# Patient Record
Sex: Female | Born: 1940 | Smoking: Former smoker
Health system: Southern US, Community
[De-identification: ages and names within clinical notes are randomized; demographics above are authoritative.]

## PROBLEM LIST (undated history)

## (undated) DIAGNOSIS — I4891 Unspecified atrial fibrillation: Secondary | ICD-10-CM

## (undated) DIAGNOSIS — I10 Essential (primary) hypertension: Secondary | ICD-10-CM

## (undated) DIAGNOSIS — I639 Cerebral infarction, unspecified: Secondary | ICD-10-CM

## (undated) DIAGNOSIS — E785 Hyperlipidemia, unspecified: Secondary | ICD-10-CM

## (undated) DIAGNOSIS — Z950 Presence of cardiac pacemaker: Secondary | ICD-10-CM

## (undated) HISTORY — DX: Cerebral infarction, unspecified: I63.9

## (undated) HISTORY — DX: Essential (primary) hypertension: I10

## (undated) HISTORY — DX: Hyperlipidemia, unspecified: E78.5

## (undated) HISTORY — PX: ABDOMINAL HYSTERECTOMY: SHX81

---

## 2018-05-05 ENCOUNTER — Inpatient Hospital Stay (HOSPITAL_COMMUNITY): Payer: Medicare Other | Admitting: Anesthesiology

## 2018-05-05 ENCOUNTER — Emergency Department (HOSPITAL_COMMUNITY): Payer: Medicare Other

## 2018-05-05 ENCOUNTER — Encounter (HOSPITAL_COMMUNITY)
Admission: EM | Disposition: A | Discharge status: Home Health | Payer: Self-pay | Source: Home / Self Care | Attending: Neurology

## 2018-05-05 ENCOUNTER — Inpatient Hospital Stay (HOSPITAL_COMMUNITY)
Admission: EM | Admit: 2018-05-05 | Discharge: 2018-05-10 | DRG: 023 | Disposition: A | Discharge status: Home Health | Payer: Medicare Other | Attending: Neurology | Admitting: Neurology

## 2018-05-05 DIAGNOSIS — R0902 Hypoxemia: Secondary | ICD-10-CM | POA: Diagnosis not present

## 2018-05-05 DIAGNOSIS — I609 Nontraumatic subarachnoid hemorrhage, unspecified: Secondary | ICD-10-CM

## 2018-05-05 DIAGNOSIS — Z9889 Other specified postprocedural states: Secondary | ICD-10-CM

## 2018-05-05 DIAGNOSIS — I63311 Cerebral infarction due to thrombosis of right middle cerebral artery: Secondary | ICD-10-CM

## 2018-05-05 DIAGNOSIS — I6601 Occlusion and stenosis of right middle cerebral artery: Secondary | ICD-10-CM | POA: Diagnosis not present

## 2018-05-05 DIAGNOSIS — Z6833 Body mass index (BMI) 33.0-33.9, adult: Secondary | ICD-10-CM

## 2018-05-05 DIAGNOSIS — I10 Essential (primary) hypertension: Secondary | ICD-10-CM | POA: Diagnosis present

## 2018-05-05 DIAGNOSIS — E876 Hypokalemia: Secondary | ICD-10-CM

## 2018-05-05 DIAGNOSIS — R414 Neurologic neglect syndrome: Secondary | ICD-10-CM | POA: Diagnosis present

## 2018-05-05 DIAGNOSIS — T81718A Complication of other artery following a procedure, not elsewhere classified, initial encounter: Secondary | ICD-10-CM

## 2018-05-05 DIAGNOSIS — I729 Aneurysm of unspecified site: Secondary | ICD-10-CM | POA: Diagnosis not present

## 2018-05-05 DIAGNOSIS — R471 Dysarthria and anarthria: Secondary | ICD-10-CM | POA: Diagnosis present

## 2018-05-05 DIAGNOSIS — I63511 Cerebral infarction due to unspecified occlusion or stenosis of right middle cerebral artery: Secondary | ICD-10-CM | POA: Diagnosis not present

## 2018-05-05 DIAGNOSIS — C7802 Secondary malignant neoplasm of left lung: Secondary | ICD-10-CM | POA: Diagnosis present

## 2018-05-05 DIAGNOSIS — I63411 Cerebral infarction due to embolism of right middle cerebral artery: Secondary | ICD-10-CM | POA: Diagnosis present

## 2018-05-05 DIAGNOSIS — C801 Malignant (primary) neoplasm, unspecified: Secondary | ICD-10-CM | POA: Diagnosis present

## 2018-05-05 DIAGNOSIS — I4891 Unspecified atrial fibrillation: Secondary | ICD-10-CM | POA: Diagnosis present

## 2018-05-05 DIAGNOSIS — R29810 Facial weakness: Secondary | ICD-10-CM | POA: Diagnosis present

## 2018-05-05 DIAGNOSIS — D62 Acute posthemorrhagic anemia: Secondary | ICD-10-CM | POA: Diagnosis not present

## 2018-05-05 DIAGNOSIS — C7801 Secondary malignant neoplasm of right lung: Secondary | ICD-10-CM | POA: Diagnosis present

## 2018-05-05 DIAGNOSIS — Z7901 Long term (current) use of anticoagulants: Secondary | ICD-10-CM

## 2018-05-05 DIAGNOSIS — I724 Aneurysm of artery of lower extremity: Secondary | ICD-10-CM | POA: Diagnosis not present

## 2018-05-05 DIAGNOSIS — I700 Atherosclerosis of aorta: Secondary | ICD-10-CM | POA: Diagnosis present

## 2018-05-05 DIAGNOSIS — I608 Other nontraumatic subarachnoid hemorrhage: Secondary | ICD-10-CM | POA: Diagnosis not present

## 2018-05-05 DIAGNOSIS — G8194 Hemiplegia, unspecified affecting left nondominant side: Secondary | ICD-10-CM | POA: Diagnosis present

## 2018-05-05 DIAGNOSIS — K869 Disease of pancreas, unspecified: Secondary | ICD-10-CM | POA: Diagnosis present

## 2018-05-05 DIAGNOSIS — R918 Other nonspecific abnormal finding of lung field: Secondary | ICD-10-CM | POA: Diagnosis not present

## 2018-05-05 DIAGNOSIS — E785 Hyperlipidemia, unspecified: Secondary | ICD-10-CM | POA: Diagnosis present

## 2018-05-05 DIAGNOSIS — R29711 NIHSS score 11: Secondary | ICD-10-CM | POA: Diagnosis present

## 2018-05-05 DIAGNOSIS — I639 Cerebral infarction, unspecified: Secondary | ICD-10-CM

## 2018-05-05 DIAGNOSIS — K8689 Other specified diseases of pancreas: Secondary | ICD-10-CM | POA: Diagnosis present

## 2018-05-05 DIAGNOSIS — K661 Hemoperitoneum: Secondary | ICD-10-CM | POA: Diagnosis not present

## 2018-05-05 DIAGNOSIS — Z950 Presence of cardiac pacemaker: Secondary | ICD-10-CM

## 2018-05-05 DIAGNOSIS — C7800 Secondary malignant neoplasm of unspecified lung: Secondary | ICD-10-CM | POA: Diagnosis present

## 2018-05-05 DIAGNOSIS — I5030 Unspecified diastolic (congestive) heart failure: Secondary | ICD-10-CM | POA: Diagnosis not present

## 2018-05-05 HISTORY — PX: RADIOLOGY WITH ANESTHESIA: SHX6223

## 2018-05-05 HISTORY — DX: Unspecified atrial fibrillation: I48.91

## 2018-05-05 HISTORY — DX: Presence of cardiac pacemaker: Z95.0

## 2018-05-05 LAB — I-STAT CHEM 8, ED
BUN: 16 mg/dL (ref 8–23)
CREATININE: 0.7 mg/dL (ref 0.44–1.00)
Calcium, Ion: 1.19 mmol/L (ref 1.15–1.40)
Chloride: 106 mmol/L (ref 98–111)
GLUCOSE: 109 mg/dL — AB (ref 70–99)
HCT: 36 % (ref 36.0–46.0)
HEMOGLOBIN: 12.2 g/dL (ref 12.0–15.0)
Potassium: 3.3 mmol/L — ABNORMAL LOW (ref 3.5–5.1)
Sodium: 140 mmol/L (ref 135–145)
TCO2: 24 mmol/L (ref 22–32)

## 2018-05-05 LAB — COMPREHENSIVE METABOLIC PANEL
ALK PHOS: 153 U/L — AB (ref 38–126)
ALT: 17 U/L (ref 0–44)
AST: 26 U/L (ref 15–41)
Albumin: 3.5 g/dL (ref 3.5–5.0)
Anion gap: 9 (ref 5–15)
BUN: 14 mg/dL (ref 8–23)
CO2: 23 mmol/L (ref 22–32)
CREATININE: 0.83 mg/dL (ref 0.44–1.00)
Calcium: 9.6 mg/dL (ref 8.9–10.3)
Chloride: 105 mmol/L (ref 98–111)
Glucose, Bld: 107 mg/dL — ABNORMAL HIGH (ref 70–99)
Potassium: 3.1 mmol/L — ABNORMAL LOW (ref 3.5–5.1)
Sodium: 137 mmol/L (ref 135–145)
Total Bilirubin: 0.5 mg/dL (ref 0.3–1.2)
Total Protein: 7.2 g/dL (ref 6.5–8.1)

## 2018-05-05 LAB — DIFFERENTIAL
Abs Immature Granulocytes: 0.02 10*3/uL (ref 0.00–0.07)
Basophils Absolute: 0.1 10*3/uL (ref 0.0–0.1)
Basophils Relative: 1 %
Eosinophils Absolute: 0.2 10*3/uL (ref 0.0–0.5)
Eosinophils Relative: 3 %
Immature Granulocytes: 0 %
LYMPHS PCT: 11 %
Lymphs Abs: 1 10*3/uL (ref 0.7–4.0)
MONO ABS: 0.7 10*3/uL (ref 0.1–1.0)
MONOS PCT: 8 %
NEUTROS ABS: 6.9 10*3/uL (ref 1.7–7.7)
NEUTROS PCT: 77 %

## 2018-05-05 LAB — APTT: APTT: 37 s — AB (ref 24–36)

## 2018-05-05 LAB — CBG MONITORING, ED: Glucose-Capillary: 98 mg/dL (ref 70–99)

## 2018-05-05 LAB — PROTIME-INR
INR: 1.32
Prothrombin Time: 16.3 seconds — ABNORMAL HIGH (ref 11.4–15.2)

## 2018-05-05 LAB — CBC
HEMATOCRIT: 30.1 % — AB (ref 36.0–46.0)
HEMOGLOBIN: 9.8 g/dL — AB (ref 12.0–15.0)
MCH: 26.3 pg (ref 26.0–34.0)
MCHC: 32.6 g/dL (ref 30.0–36.0)
MCV: 80.9 fL (ref 80.0–100.0)
Platelets: 196 10*3/uL (ref 150–400)
RBC: 3.72 MIL/uL — ABNORMAL LOW (ref 3.87–5.11)
RDW: 16.5 % — ABNORMAL HIGH (ref 11.5–15.5)
WBC: 8.9 10*3/uL (ref 4.0–10.5)
nRBC: 0 % (ref 0.0–0.2)

## 2018-05-05 LAB — I-STAT TROPONIN, ED: TROPONIN I, POC: 0 ng/mL (ref 0.00–0.08)

## 2018-05-05 SURGERY — RADIOLOGY WITH ANESTHESIA
Anesthesia: General

## 2018-05-05 MED ORDER — SENNOSIDES-DOCUSATE SODIUM 8.6-50 MG PO TABS
1.0000 | ORAL_TABLET | Freq: Every evening | ORAL | Status: DC | PRN
  Administered 2018-05-07 – 2018-05-08 (×2): 1 via ORAL
  Filled 2018-05-05 (×2): qty 1

## 2018-05-05 MED ORDER — CEFAZOLIN SODIUM-DEXTROSE 2-4 GM/100ML-% IV SOLN
INTRAVENOUS | Status: AC
  Filled 2018-05-05: qty 100

## 2018-05-05 MED ORDER — ACETAMINOPHEN 160 MG/5ML PO SOLN: 650.0000 mg | ORAL | Status: DC | PRN

## 2018-05-05 MED ORDER — LIDOCAINE 2% (20 MG/ML) 5 ML SYRINGE
INTRAMUSCULAR | Status: DC | PRN
  Administered 2018-05-05: 60 mg via INTRAVENOUS

## 2018-05-05 MED ORDER — CEFAZOLIN SODIUM-DEXTROSE 2-3 GM-%(50ML) IV SOLR
INTRAVENOUS | Status: DC | PRN
  Administered 2018-05-05: 2 g via INTRAVENOUS

## 2018-05-05 MED ORDER — LIDOCAINE HCL 1 % IJ SOLN
INTRAMUSCULAR | Status: AC
  Filled 2018-05-05: qty 20

## 2018-05-05 MED ORDER — NITROGLYCERIN 1 MG/10 ML FOR IR/CATH LAB
INTRA_ARTERIAL | Status: AC | PRN
  Administered 2018-05-05 (×6): 25 ug via INTRA_ARTERIAL

## 2018-05-05 MED ORDER — CLOPIDOGREL BISULFATE 300 MG PO TABS
ORAL_TABLET | ORAL | Status: AC
  Filled 2018-05-05: qty 1

## 2018-05-05 MED ORDER — TIROFIBAN HCL IN NACL 5-0.9 MG/100ML-% IV SOLN
INTRAVENOUS | Status: AC
  Filled 2018-05-05: qty 100

## 2018-05-05 MED ORDER — ASPIRIN 81 MG PO CHEW
CHEWABLE_TABLET | ORAL | Status: AC | PRN
  Administered 2018-05-05: 81 mg

## 2018-05-05 MED ORDER — LABETALOL HCL 5 MG/ML IV SOLN
INTRAVENOUS | Status: AC
  Filled 2018-05-05: qty 4

## 2018-05-05 MED ORDER — ACETAMINOPHEN 325 MG PO TABS: 650.0000 mg | ORAL_TABLET | ORAL | Status: DC | PRN

## 2018-05-05 MED ORDER — ACETAMINOPHEN 650 MG RE SUPP: 650.0000 mg | RECTAL | Status: DC | PRN

## 2018-05-05 MED ORDER — ASPIRIN 325 MG PO TABS
ORAL_TABLET | ORAL | Status: AC
  Filled 2018-05-05: qty 1

## 2018-05-05 MED ORDER — SUCCINYLCHOLINE CHLORIDE 20 MG/ML IJ SOLN
INTRAMUSCULAR | Status: DC | PRN
  Administered 2018-05-05: 80 mg via INTRAVENOUS

## 2018-05-05 MED ORDER — IOPAMIDOL (ISOVUE-300) INJECTION 61%
INTRAVENOUS | Status: AC
  Filled 2018-05-05: qty 100

## 2018-05-05 MED ORDER — NITROGLYCERIN 1 MG/10 ML FOR IR/CATH LAB
INTRA_ARTERIAL | Status: AC
  Filled 2018-05-05: qty 10

## 2018-05-05 MED ORDER — IOHEXOL 300 MG/ML  SOLN
150.0000 mL | Freq: Once | INTRAMUSCULAR | Status: AC | PRN
  Administered 2018-05-05: 75 mL via INTRA_ARTERIAL

## 2018-05-05 MED ORDER — TICAGRELOR 90 MG PO TABS
ORAL_TABLET | ORAL | Status: AC
  Filled 2018-05-05: qty 2

## 2018-05-05 MED ORDER — PROPOFOL 10 MG/ML IV BOLUS
INTRAVENOUS | Status: DC | PRN
  Administered 2018-05-05: 90 mg via INTRAVENOUS
  Administered 2018-05-05: 20 mg via INTRAVENOUS

## 2018-05-05 MED ORDER — SODIUM CHLORIDE 0.9 % IV SOLN: INTRAVENOUS | Status: DC

## 2018-05-05 MED ORDER — LACTATED RINGERS IV SOLN
INTRAVENOUS | Status: DC | PRN
  Administered 2018-05-05 – 2018-05-06 (×2): via INTRAVENOUS

## 2018-05-05 MED ORDER — ASPIRIN 81 MG PO CHEW
CHEWABLE_TABLET | ORAL | Status: AC
  Filled 2018-05-05: qty 1

## 2018-05-05 MED ORDER — ROCURONIUM BROMIDE 50 MG/5ML IV SOSY
PREFILLED_SYRINGE | INTRAVENOUS | Status: DC | PRN
  Administered 2018-05-05: 10 mg via INTRAVENOUS
  Administered 2018-05-05: 50 mg via INTRAVENOUS
  Administered 2018-05-05: 10 mg via INTRAVENOUS

## 2018-05-05 MED ORDER — TICAGRELOR 60 MG PO TABS
ORAL_TABLET | ORAL | Status: AC | PRN
  Administered 2018-05-05: 180 mg

## 2018-05-05 MED ORDER — IOPAMIDOL (ISOVUE-370) INJECTION 76%
50.0000 mL | Freq: Once | INTRAVENOUS | Status: AC | PRN
  Administered 2018-05-05: 50 mL via INTRAVENOUS

## 2018-05-05 MED ORDER — IOPAMIDOL (ISOVUE-370) INJECTION 76%
INTRAVENOUS | Status: AC
  Filled 2018-05-05: qty 50

## 2018-05-05 MED ORDER — STROKE: EARLY STAGES OF RECOVERY BOOK
Freq: Once | Status: AC
  Administered 2018-05-07: 15:00:00
  Filled 2018-05-05: qty 1

## 2018-05-05 MED ORDER — PHENYLEPHRINE HCL 10 MG/ML IJ SOLN
INTRAMUSCULAR | Status: DC | PRN
  Administered 2018-05-05 (×5): 80 ug via INTRAVENOUS

## 2018-05-05 MED ORDER — FENTANYL CITRATE (PF) 100 MCG/2ML IJ SOLN
INTRAMUSCULAR | Status: DC | PRN
  Administered 2018-05-05: 100 ug via INTRAVENOUS

## 2018-05-05 MED ORDER — EPTIFIBATIDE 20 MG/10ML IV SOLN
INTRAVENOUS | Status: AC
  Filled 2018-05-05: qty 10

## 2018-05-05 NOTE — Anesthesia Preprocedure Evaluation (Addendum)
Anesthesia Evaluation  Patient identified by MRN, date of birth, ID band Patient awake    Reviewed: Allergy & Precautions, H&P , NPO status , Patient's Chart, lab work & pertinent test results  Airway Mallampati: II  TM Distance: >3 FB Neck ROM: Full    Dental no notable dental hx. (+) Edentulous Upper, Partial Lower, Dental Advisory Given   Pulmonary neg pulmonary ROS,    Pulmonary exam normal breath sounds clear to auscultation       Cardiovascular + dysrhythmias Atrial Fibrillation  Rhythm:Regular Rate:Normal     Neuro/Psych CVA, Residual Symptoms negative psych ROS   GI/Hepatic negative GI ROS, Neg liver ROS,   Endo/Other  negative endocrine ROS  Renal/GU negative Renal ROS  negative genitourinary   Musculoskeletal   Abdominal   Peds  Hematology negative hematology ROS (+)   Anesthesia Other Findings   Reproductive/Obstetrics negative OB ROS                            Anesthesia Physical Anesthesia Plan  ASA: III and emergent  Anesthesia Plan: General   Post-op Pain Management:    Induction: Intravenous, Rapid sequence and Cricoid pressure planned  PONV Risk Score and Plan: 3 and Ondansetron and Treatment may vary due to age or medical condition  Airway Management Planned: Oral ETT  Additional Equipment: Arterial line  Intra-op Plan:   Post-operative Plan: Extubation in OR and Possible Post-op intubation/ventilation  Informed Consent: I have reviewed the patients History and Physical, chart, labs and discussed the procedure including the risks, benefits and alternatives for the proposed anesthesia with the patient or authorized representative who has indicated his/her understanding and acceptance.   Dental advisory given  Plan Discussed with: CRNA  Anesthesia Plan Comments:        Anesthesia Quick Evaluation

## 2018-05-05 NOTE — H&P (Signed)
Chief Complaint: Fall, left facial droop, left side weakness  History obtained from: Patient and Chart    HPI:                                                                                                                                       Deanna Guerrero is an 77 y.o. female with past medical history of atrial fibrillation on Eliquis, recent pacemaker placement presents to the emergency room as a code stroke for left-sided weakness and neglect and facial droop.  She was last seen normal around 7 PM by her family.  Around 715 patient had a fall and noted to be weak on the left side.  EMS was called who noted the patient had a facial droop neglect left-sided weakness and called code stroke.  Blood pressure was 250 systolic on arrival.  Patient on Eliquis at home, denies missing medications.  CT head showed no acute abnormality.  She was not a TPA candidate as she was on anticoagulation.  CT angiogram revealed a right M1-M2 occlusion, and IR was activated.  Date last known well: 10.12.19 Time last known well: 7pm tPA Given: no, on Eliquis NIHSS: 11 Baseline MRS 0   PMH Afib Pacemaker placement     No family history on file.   Social History:  has no tobacco, alcohol, and drug history on file.  Allergies: Allergies not on file  Medications:                                                                                                                      I reviewed home medications. She is on Eliquis   ROS:  14 systems reviewed and negative except above    Examination:                                                                                                      General: Appears well-developed  Psych: Affect appropriate to situation Eyes: No scleral injection HENT: No OP obstrucion Head: Normocephalic.  Cardiovascular: Normal  rate and regular rhythm.  Respiratory: Effort normal and breath sounds normal to anterior ascultation GI: Soft.  No distension. There is no tenderness.  Skin: WDI    Neurological Examination Mental Status: Alert, oriented, thought content appropriate.  Speech dysarthric, follows simple commands. Cranial Nerves: II: Visual fields : Left homonymous hemianopsia III,IV, VI: ptosis not present, extra-ocular motions intact bilaterally, pupils equal, round, reactive to light and accommodation, gaze can overcome midline V,VII: left Facial droop  VIII: hearing normal bilaterally IX,X: uvula rises symmetrically XI: bilateral shoulder shrug XII: midline tongue extension Motor: Right : Upper extremity   5/5    Left:     Upper extremity   1/5  Lower extremity   5/5     Lower extremity   2+/5 Tone and bulk:normal tone throughout; no atrophy noted Sensory: Reduced over left arm leg and face with neglect Deep Tendon Reflexes: 1+ and symmetric throughout Plantars: Right: downgoing   Left: downgoing Cerebellar: normal finger-to-nose, normal on right side Gait: Unable to walk     Lab Results: Basic Metabolic Panel: Recent Labs  Lab 05/05/18 2009  NA 140  K 3.3*  CL 106  GLUCOSE 109*  BUN 16  CREATININE 0.70    CBC: Recent Labs  Lab 05/05/18 2009 05/05/18 2031  WBC  --  PENDING  HGB 12.2 9.8*  HCT 36.0 30.1*  MCV  --  80.9  PLT  --  196    Coagulation Studies: No results for input(s): LABPROT, INR in the last 72 hours.  Imaging: Ct Angio Head W Or Wo Contrast  Result Date: 05/05/2018 CLINICAL DATA:  LEFT-sided weakness. EXAM: CT ANGIOGRAPHY HEAD AND NECK TECHNIQUE: Multidetector CT imaging of the head and neck was performed using the standard protocol during bolus administration of intravenous contrast. Multiplanar CT image reconstructions and MIPs were obtained to evaluate the vascular anatomy. Carotid stenosis measurements (when applicable) are obtained utilizing NASCET  criteria, using the distal internal carotid diameter as the denominator. CONTRAST:  41mL ISOVUE-370 IOPAMIDOL (ISOVUE-370) INJECTION 76% COMPARISON:  Code stroke CT earlier today. FINDINGS: CTA NECK FINDINGS Aortic arch: Standard branching. Imaged portion shows no evidence of aneurysm or dissection. No significant stenosis of the major arch vessel origins. Right carotid system: No evidence of dissection, stenosis (50% or greater) or occlusion. Minor atheromatous change. Left carotid system: No evidence of dissection, stenosis (50% or greater) or occlusion. Minor atheromatous change. Vertebral arteries: Codominant. No evidence of dissection, stenosis (50% or greater) or occlusion. Skeleton: Spondylosis.  Poor dentition. Other neck: No masses.  Airway patent. Upper chest: Widespread BILATERAL pulmonary nodules, subcentimeter, too numerous to count, slightly larger and more numerous to the LEFT. There is mediastinal adenopathy, incompletely evaluated. These nodules  could represent diffuse infection or widespread metastatic disease. Small RIGHT effusion. Aortic atherosclerosis is noted. Dual lead pacemaker enters the heart from LEFT subclavian approach. Review of the MIP images confirms the above findings CTA HEAD FINDINGS Anterior circulation: Minor calcific atheromatous change in the BILATERAL carotid siphons. There is a RIGHT MCA M2 posterior division occlusion, 3 mm from the bifurcation. No M1 stenosis on the RIGHT. Both anterior cerebral arteries are patent, LEFT dominant. No LEFT MCA disease of significance. Posterior circulation: No significant stenosis, proximal occlusion, aneurysm, or vascular malformation. Venous sinuses: As permitted by contrast timing, patent. Anatomic variants: RIGHT fetal PCA. Delayed phase: Not performed. Review of the MIP images confirms the above findings IMPRESSION: No extracranial stenosis or dissection. BILATERAL carotid atheromatous change. Acute RIGHT MCA M2 occlusion, 3 mm from  the bifurcation. Innumerable subcentimeter pulmonary nodules, which could represent infection or metastatic disease. CT chest with contrast could provide additional information. These results were called by telephone at the time of interpretation on 05/05/2018 at 8:40 pm to Dr. Samara Snide , who verbally acknowledged these results. Electronically Signed   By: Staci Righter M.D.   On: 05/05/2018 20:51   Ct Angio Neck W Or Wo Contrast  Result Date: 05/05/2018 CLINICAL DATA:  LEFT-sided weakness. EXAM: CT ANGIOGRAPHY HEAD AND NECK TECHNIQUE: Multidetector CT imaging of the head and neck was performed using the standard protocol during bolus administration of intravenous contrast. Multiplanar CT image reconstructions and MIPs were obtained to evaluate the vascular anatomy. Carotid stenosis measurements (when applicable) are obtained utilizing NASCET criteria, using the distal internal carotid diameter as the denominator. CONTRAST:  53mL ISOVUE-370 IOPAMIDOL (ISOVUE-370) INJECTION 76% COMPARISON:  Code stroke CT earlier today. FINDINGS: CTA NECK FINDINGS Aortic arch: Standard branching. Imaged portion shows no evidence of aneurysm or dissection. No significant stenosis of the major arch vessel origins. Right carotid system: No evidence of dissection, stenosis (50% or greater) or occlusion. Minor atheromatous change. Left carotid system: No evidence of dissection, stenosis (50% or greater) or occlusion. Minor atheromatous change. Vertebral arteries: Codominant. No evidence of dissection, stenosis (50% or greater) or occlusion. Skeleton: Spondylosis.  Poor dentition. Other neck: No masses.  Airway patent. Upper chest: Widespread BILATERAL pulmonary nodules, subcentimeter, too numerous to count, slightly larger and more numerous to the LEFT. There is mediastinal adenopathy, incompletely evaluated. These nodules could represent diffuse infection or widespread metastatic disease. Small RIGHT effusion. Aortic  atherosclerosis is noted. Dual lead pacemaker enters the heart from LEFT subclavian approach. Review of the MIP images confirms the above findings CTA HEAD FINDINGS Anterior circulation: Minor calcific atheromatous change in the BILATERAL carotid siphons. There is a RIGHT MCA M2 posterior division occlusion, 3 mm from the bifurcation. No M1 stenosis on the RIGHT. Both anterior cerebral arteries are patent, LEFT dominant. No LEFT MCA disease of significance. Posterior circulation: No significant stenosis, proximal occlusion, aneurysm, or vascular malformation. Venous sinuses: As permitted by contrast timing, patent. Anatomic variants: RIGHT fetal PCA. Delayed phase: Not performed. Review of the MIP images confirms the above findings IMPRESSION: No extracranial stenosis or dissection. BILATERAL carotid atheromatous change. 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 Innumerable subcentimeter pulmonary nodules, which could represent infection or metastatic disease. CT chest with contrast could provide additional information. These results were called by telephone at the time of interpretation on 05/05/2018 at 8:40 pm to Dr. Samara Snide , who verbally acknowledged these results. Electronically Signed   By: Staci Righter M.D.   On: 05/05/2018 20:51  Ct Head Code Stroke Wo Contrast  Result Date: 05/05/2018 CLINICAL DATA:  Code stroke.  LEFT-sided deficits. EXAM: CT HEAD WITHOUT CONTRAST TECHNIQUE: Contiguous axial images were obtained from the base of the skull through the vertex without intravenous contrast. COMPARISON:  None. FINDINGS: Brain: No evidence for acute infarction, hemorrhage, mass lesion, hydrocephalus, or extra-axial fluid. Generalized atrophy. Hypoattenuation of white matter, likely small vessel disease. Vascular: Calcification of the cavernous internal carotid arteries consistent with cerebrovascular atherosclerotic disease. No signs of intracranial large vessel occlusion. Skull:  Calvarium intact.  No worrisome osseous lesion. Sinuses/Orbits: Dense lenticular opacities. No significant sinus fluid. Other: None. ASPECTS [facility] Stroke Program Early CT Score) - Ganglionic level infarction (caudate, lentiform nuclei, internal capsule, insula, M1-M3 cortex): 7 - Supraganglionic infarction (M4-M6 cortex): 3 Total score (0-10 with 10 being normal): 10 IMPRESSION: 1. Atrophy and small vessel disease. No acute intracranial findings. No signs of large vessel occlusion. 2. ASPECTS is 10. These results were communicated to Dr. Lorraine Lax at 8:25 pmon 10/12/2019by text page via the [facility] messaging system. Electronically Signed   By: Staci Righter M.D.   On: 05/05/2018 20:26     ASSESSMENT AND PLAN  77 year old female with history of atrial fibrillation on Eliquis presents with sudden onset left-sided weakness and neglect.  CTA showed a right M2 occlusion.  Patient underwent IR, however required stenting due to reocclusion.  She achieved TICI 3 recanalization and extubated, doing well postprocedure.  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  She is at risk for significant risk of neurological worsening from cerebral edema,  death from brain herniation, heart failure, hemorrhagic conversion, infection, respiratory failure and seizure. This  patient's care requires constant monitoring of vital signs, hemodynamics, respiratory and cardiac monitoring, review of multiple databases, neurological assessment, discussion with family, other specialists and medical decision making of high complexity.  I spent 70  minutes of neurocritical time in the care of this patient.      Sushanth Aroor [facility] Pager Number [phone]   Please page stroke NP  Or  PA  Or MD from 8am -4 pm  as this patient from this time will be  followed by the stroke.   You can look them up on [email or web address]  Password [email or web address]

## 2018-05-05 NOTE — Significant Event (Signed)
Rapid Response Event Note  Code stroke called at [place]. Pt with L sided weakness, LSW 1830, on Eliquis. Pt arrived at 2000, Dr Aroor at bedside, to [place], Initial NIH 11. IR team consulted at 2025. Foley placed, pt arrived to IR 2035. Pulses marked, groins shaved. IR team arrived 2047. Report given   Event started at 2006 Event ended at 2115  Sherilyn Dacosta

## 2018-05-05 NOTE — Anesthesia Procedure Notes (Signed)
Arterial Line Insertion Start/End10/06/2018 9:15 PM, 05/05/2018 9:20 PM Performed by: Roderic Palau, MD, anesthesiologist  Patient location: Pre-op. Preanesthetic checklist: patient identified, IV checked, site marked, risks and benefits discussed, surgical consent, monitors and equipment checked, pre-op evaluation, timeout performed and anesthesia consent Lidocaine 1% used for infiltration Left, radial was placed Catheter size: 20 Fr Hand hygiene performed , maximum sterile barriers used  and Seldinger technique used  Attempts: 1 Procedure performed without using ultrasound guided technique. Following insertion, dressing applied and Biopatch. Post procedure assessment: normal and unchanged  Patient tolerated the procedure well with no immediate complications.

## 2018-05-05 NOTE — ED Notes (Signed)
Blood tubed to main lab.

## 2018-05-05 NOTE — ED Provider Notes (Signed)
[place] EMERGENCY DEPARTMENT Provider Note   CSN: [id number] Arrival date & time:      An emergency department physician performed an initial assessment on this suspected stroke patient at 2003.  History   Chief Complaint No chief complaint on file.   HPI Deanna Guerrero is a 77 y.o. female.  HPI   77 year old female with unknown medical history presents with chief complaint of strokelike symptoms.  Patient was at home when she had an unwitnessed fall.  Patient able to crawl to the phone and call for help.  On arrival EMS noted that patient had left-sided facial droop, difficult speech and left-sided weakness.  Patient activated a stroke and brought to ED.  No past medical history on file.  Patient Active Problem List   Diagnosis Date Noted  . Acute right arterial ischemic stroke, middle cerebral artery (MCA) ([place]) 05/05/2018     The histories are not reviewed yet. Please review them in the "History" navigator section and refresh this [place].   OB History   None      Home Medications    Prior to Admission medications   Not on File    Family History No family history on file.  Social History Social History   Tobacco Use  . Smoking status: Not on file  Substance Use Topics  . Alcohol use: Not on file  . Drug use: Not on file     Allergies   Patient has no allergy information on record.   Review of Systems Review of Systems  Unable to perform ROS: Acuity of condition     Physical Exam Updated Vital Signs BP (!) 168/94   Pulse 70   Resp 18   SpO2 94%   Physical Exam  Constitutional: She appears well-developed and well-nourished. No distress.  HENT:  Head: Normocephalic and atraumatic.  Eyes: Conjunctivae are normal.  Neck: Neck supple.  Cardiovascular: Normal rate and regular rhythm.  No murmur heard. Pulmonary/Chest: Effort normal and breath sounds normal. No respiratory distress.  Abdominal: Soft. There is no  tenderness.  Musculoskeletal: She exhibits no edema.  Neurological: She is alert.  Patient with left-sided facial droop, dysarthria, left upper and lower extremity weakness.  Skin: Skin is warm and dry.  Psychiatric: She has a normal mood and affect.  Nursing note and vitals reviewed.    ED Treatments / Results  Labs (all labs ordered are listed, but only abnormal results are displayed) Labs Reviewed  PROTIME-INR - Abnormal; Notable for the following components:      Result Value   Prothrombin Time 16.3 (*)    All other components within normal limits  APTT - Abnormal; Notable for the following components:   aPTT 37 (*)    All other components within normal limits  CBC - Abnormal; Notable for the following components:   RBC 3.72 (*)    Hemoglobin 9.8 (*)    HCT 30.1 (*)    RDW 16.5 (*)    All other components within normal limits  COMPREHENSIVE METABOLIC PANEL - Abnormal; Notable for the following components:   Potassium 3.1 (*)    Glucose, Bld 107 (*)    Alkaline Phosphatase 153 (*)    All other components within normal limits  I-STAT CHEM 8, ED - Abnormal; Notable for the following components:   Potassium 3.3 (*)    Glucose, Bld 109 (*)    All other components within normal limits  DIFFERENTIAL  HEMOGLOBIN A1C  LIPID PANEL  I-STAT TROPONIN, ED  CBG MONITORING, ED    EKG None  Radiology Ct Angio Head W Or Wo Contrast  Result Date: 05/05/2018 CLINICAL DATA:  LEFT-sided weakness. EXAM: CT ANGIOGRAPHY HEAD AND NECK TECHNIQUE: Multidetector CT imaging of the head and neck was performed using the standard protocol during bolus administration of intravenous contrast. Multiplanar CT image reconstructions and MIPs were obtained to evaluate the vascular anatomy. Carotid stenosis measurements (when applicable) are obtained utilizing NASCET criteria, using the distal internal carotid diameter as the denominator. CONTRAST:  73m ISOVUE-370 IOPAMIDOL (ISOVUE-370) INJECTION 76%  COMPARISON:  Code stroke CT earlier today. FINDINGS: CTA NECK FINDINGS Aortic arch: Standard branching. Imaged portion shows no evidence of aneurysm or dissection. No significant stenosis of the major arch vessel origins. Right carotid system: No evidence of dissection, stenosis (50% or greater) or occlusion. Minor atheromatous change. Left carotid system: No evidence of dissection, stenosis (50% or greater) or occlusion. Minor atheromatous change. Vertebral arteries: Codominant. No evidence of dissection, stenosis (50% or greater) or occlusion. Skeleton: Spondylosis.  Poor dentition. Other neck: No masses.  Airway patent. Upper chest: Widespread BILATERAL pulmonary nodules, subcentimeter, too numerous to count, slightly larger and more numerous to the LEFT. There is mediastinal adenopathy, incompletely evaluated. These nodules could represent diffuse infection or widespread metastatic disease. Small RIGHT effusion. Aortic atherosclerosis is noted. Dual lead pacemaker enters the heart from LEFT subclavian approach. Review of the MIP images confirms the above findings CTA HEAD FINDINGS Anterior circulation: Minor calcific atheromatous change in the BILATERAL carotid siphons. There is a RIGHT MCA M2 posterior division occlusion, 3 mm from the bifurcation. No M1 stenosis on the RIGHT. Both anterior cerebral arteries are patent, LEFT dominant. No LEFT MCA disease of significance. Posterior circulation: No significant stenosis, proximal occlusion, aneurysm, or vascular malformation. Venous sinuses: As permitted by contrast timing, patent. Anatomic variants: RIGHT fetal PCA. Delayed phase: Not performed. Review of the MIP images confirms the above findings IMPRESSION: No extracranial stenosis or dissection. BILATERAL carotid atheromatous change. 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 Innumerable subcentimeter pulmonary nodules, which could represent infection or metastatic disease. CT chest with contrast  could provide additional information. These results were called by telephone at the time of interpretation on 05/05/2018 at 8:40 pm to Dr. SSamara Snide, who verbally acknowledged these results. Electronically Signed   By: JStaci RighterM.D.   On: 05/05/2018 20:51   Ct Angio Neck W Or Wo Contrast  Result Date: 05/05/2018 CLINICAL DATA:  LEFT-sided weakness. EXAM: CT ANGIOGRAPHY HEAD AND NECK TECHNIQUE: Multidetector CT imaging of the head and neck was performed using the standard protocol during bolus administration of intravenous contrast. Multiplanar CT image reconstructions and MIPs were obtained to evaluate the vascular anatomy. Carotid stenosis measurements (when applicable) are obtained utilizing NASCET criteria, using the distal internal carotid diameter as the denominator. CONTRAST:  593mISOVUE-370 IOPAMIDOL (ISOVUE-370) INJECTION 76% COMPARISON:  Code stroke CT earlier today. FINDINGS: CTA NECK FINDINGS Aortic arch: Standard branching. Imaged portion shows no evidence of aneurysm or dissection. No significant stenosis of the major arch vessel origins. Right carotid system: No evidence of dissection, stenosis (50% or greater) or occlusion. Minor atheromatous change. Left carotid system: No evidence of dissection, stenosis (50% or greater) or occlusion. Minor atheromatous change. Vertebral arteries: Codominant. No evidence of dissection, stenosis (50% or greater) or occlusion. Skeleton: Spondylosis.  Poor dentition. Other neck: No masses.  Airway patent. Upper chest: Widespread BILATERAL pulmonary nodules, subcentimeter, too numerous to count,  slightly larger and more numerous to the LEFT. There is mediastinal adenopathy, incompletely evaluated. These nodules could represent diffuse infection or widespread metastatic disease. Small RIGHT effusion. Aortic atherosclerosis is noted. Dual lead pacemaker enters the heart from LEFT subclavian approach. Review of the MIP images confirms the above findings  CTA HEAD FINDINGS Anterior circulation: Minor calcific atheromatous change in the BILATERAL carotid siphons. There is a RIGHT MCA M2 posterior division occlusion, 3 mm from the bifurcation. No M1 stenosis on the RIGHT. Both anterior cerebral arteries are patent, LEFT dominant. No LEFT MCA disease of significance. Posterior circulation: No significant stenosis, proximal occlusion, aneurysm, or vascular malformation. Venous sinuses: As permitted by contrast timing, patent. Anatomic variants: RIGHT fetal PCA. Delayed phase: Not performed. Review of the MIP images confirms the above findings IMPRESSION: No extracranial stenosis or dissection. BILATERAL carotid atheromatous change. 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 Innumerable subcentimeter pulmonary nodules, which could represent infection or metastatic disease. CT chest with contrast could provide additional information. These results were called by telephone at the time of interpretation on 05/05/2018 at 8:40 pm to Dr. Samara Snide , who verbally acknowledged these results. Electronically Signed   By: Staci Righter M.D.   On: 05/05/2018 20:51   Ct Head Code Stroke Wo Contrast  Result Date: 05/05/2018 CLINICAL DATA:  Code stroke.  LEFT-sided deficits. EXAM: CT HEAD WITHOUT CONTRAST TECHNIQUE: Contiguous axial images were obtained from the base of the skull through the vertex without intravenous contrast. COMPARISON:  None. FINDINGS: Brain: No evidence for acute infarction, hemorrhage, mass lesion, hydrocephalus, or extra-axial fluid. Generalized atrophy. Hypoattenuation of white matter, likely small vessel disease. Vascular: Calcification of the cavernous internal carotid arteries consistent with cerebrovascular atherosclerotic disease. No signs of intracranial large vessel occlusion. Skull: Calvarium intact.  No worrisome osseous lesion. Sinuses/Orbits: Dense lenticular opacities. No significant sinus fluid. Other: None. ASPECTS [facility]  Stroke Program Early CT Score) - Ganglionic level infarction (caudate, lentiform nuclei, internal capsule, insula, M1-M3 cortex): 7 - Supraganglionic infarction (M4-M6 cortex): 3 Total score (0-10 with 10 being normal): 10 IMPRESSION: 1. Atrophy and small vessel disease. No acute intracranial findings. No signs of large vessel occlusion. 2. ASPECTS is 10. These results were communicated to Dr. Lorraine Lax at 8:25 pmon 10/12/2019by text page via the [facility] messaging system. Electronically Signed   By: Staci Righter M.D.   On: 05/05/2018 20:26    Procedures Procedures (including critical care time)  Medications Ordered in ED Medications  labetalol (NORMODYNE,TRANDATE) 5 MG/ML injection (has no administration in time range)  iopamidol (ISOVUE-370) 76 % injection (has no administration in time range)  tirofiban (AGGRASTAT) 5-0.9 MG/100ML-% injection (has no administration in time range)  ticagrelor (BRILINTA) 90 MG tablet (has no administration in time range)  aspirin 325 MG tablet (has no administration in time range)  clopidogrel (PLAVIX) 300 MG tablet (has no administration in time range)  lidocaine (XYLOCAINE) 1 % (with pres) injection (has no administration in time range)  nitroGLYCERIN 100 mcg/mL intra-arterial injection (has no administration in time range)  eptifibatide (INTEGRILIN) 20 MG/10ML injection (has no administration in time range)   stroke: mapping our early stages of recovery book (has no administration in time range)  0.9 %  sodium chloride infusion (has no administration in time range)  acetaminophen (TYLENOL) tablet 650 mg (has no administration in time range)    Or  acetaminophen (TYLENOL) solution 650 mg (has no administration in time range)    Or  acetaminophen (TYLENOL) suppository  650 mg (has no administration in time range)  senna-docusate (Senokot-S) tablet 1 tablet (has no administration in time range)  ceFAZolin (ANCEF) 2-4 GM/100ML-% IVPB (has no administration in time  range)  iohexol (OMNIPAQUE) 300 MG/ML solution 150 mL (has no administration in time range)  nitroGLYCERIN 1 mg/10 mL (100 mcg/mL) - IR/CATH LAB (25 mcg Intra-arterial Given 05/05/18 2309)  iohexol (OMNIPAQUE) 300 MG/ML solution 150 mL (has no administration in time range)  aspirin 81 MG chewable tablet (has no administration in time range)  ticagrelor (BRILINTA) tablet (180 mg Per Tube Given 05/05/18 2249)  aspirin chewable tablet (81 mg Per Tube Given 05/05/18 2252)  iopamidol (ISOVUE-370) 76 % injection 50 mL (50 mLs Intravenous Contrast Given 05/05/18 2013)     Initial Impression / Assessment and Plan / ED Course  I have reviewed the triage vital signs and the nursing notes.  Pertinent labs & imaging results that were available during my care of the patient were reviewed by me and considered in my medical decision making (see chart for details).     77 year old female with unknown medical history presents with chief complaint of strokelike symptoms.  She is above.  Patient had viscus her prior to arrival.  Patient met in lobby by ED physician and neurologist.  Airway protected, no acute intervention required.  Patient taken to CT scanner.  Patient taken directly from CT scanner to IR lab.  Please see neurologist notes for continuation of care.  Patient seen just with my attending Dr. Zenia Resides who agrees with plan and disposition.  Final Clinical Impressions(s) / ED Diagnoses   Final diagnoses:  Stroke [facility])  Stroke (cerebrum) [facility])    ED Discharge Orders    None       Keenan Bachelor, MD 05/05/18 2325    Lacretia Leigh, MD 05/06/18 2229

## 2018-05-05 NOTE — Anesthesia Procedure Notes (Signed)
Procedure Name: Intubation Date/Time: 05/05/2018 9:07 PM Performed by: Purvis Kilts, CRNA Pre-anesthesia Checklist: Patient identified, Emergency Drugs available, Suction available, Patient being monitored and Timeout performed Patient Re-evaluated:Patient Re-evaluated prior to induction Oxygen Delivery Method: Circle system utilized Preoxygenation: Pre-oxygenation with 100% oxygen Induction Type: IV induction Laryngoscope Size: Mac and 3 Grade View: Grade I Tube type: Oral Tube size: 7.0 mm Number of attempts: 1 Airway Equipment and Method: Stylet Placement Confirmation: ETT inserted through vocal cords under direct vision,  positive ETCO2 and breath sounds checked- equal and bilateral Secured at: 21 cm Tube secured with: Tape Dental Injury: Teeth and Oropharynx as per pre-operative assessment

## 2018-05-06 ENCOUNTER — Inpatient Hospital Stay (HOSPITAL_COMMUNITY): Payer: Medicare Other

## 2018-05-06 ENCOUNTER — Encounter (HOSPITAL_COMMUNITY): Payer: Self-pay | Admitting: Emergency Medicine

## 2018-05-06 DIAGNOSIS — I6601 Occlusion and stenosis of right middle cerebral artery: Secondary | ICD-10-CM

## 2018-05-06 DIAGNOSIS — I5030 Unspecified diastolic (congestive) heart failure: Secondary | ICD-10-CM

## 2018-05-06 HISTORY — PX: IR CT HEAD LTD: IMG2386

## 2018-05-06 HISTORY — PX: IR PERCUTANEOUS ART THROMBECTOMY/INFUSION INTRACRANIAL INC DIAG ANGIO: IMG6087

## 2018-05-06 HISTORY — PX: IR ANGIO VERTEBRAL SEL SUBCLAVIAN INNOMINATE UNI R MOD SED: IMG5365

## 2018-05-06 HISTORY — PX: IR INTRA CRAN STENT: IMG2345

## 2018-05-06 LAB — ECHOCARDIOGRAM COMPLETE: HEIGHTINCHES: 63 in

## 2018-05-06 LAB — CBC WITH DIFFERENTIAL/PLATELET
ABS IMMATURE GRANULOCYTES: 0.06 10*3/uL (ref 0.00–0.07)
Basophils Absolute: 0 10*3/uL (ref 0.0–0.1)
Basophils Relative: 0 %
Eosinophils Absolute: 0 10*3/uL (ref 0.0–0.5)
Eosinophils Relative: 0 %
HEMATOCRIT: 24.3 % — AB (ref 36.0–46.0)
Hemoglobin: 8 g/dL — ABNORMAL LOW (ref 12.0–15.0)
IMMATURE GRANULOCYTES: 1 %
LYMPHS ABS: 0.5 10*3/uL — AB (ref 0.7–4.0)
Lymphocytes Relative: 5 %
MCH: 26.5 pg (ref 26.0–34.0)
MCHC: 32.9 g/dL (ref 30.0–36.0)
MCV: 80.5 fL (ref 80.0–100.0)
MONO ABS: 0.5 10*3/uL (ref 0.1–1.0)
MONOS PCT: 5 %
NEUTROS ABS: 9.5 10*3/uL — AB (ref 1.7–7.7)
NEUTROS PCT: 89 %
Platelets: 171 10*3/uL (ref 150–400)
RBC: 3.02 MIL/uL — ABNORMAL LOW (ref 3.87–5.11)
RDW: 16.7 % — ABNORMAL HIGH (ref 11.5–15.5)
WBC: 10.6 10*3/uL — ABNORMAL HIGH (ref 4.0–10.5)
nRBC: 0 % (ref 0.0–0.2)

## 2018-05-06 LAB — BASIC METABOLIC PANEL
ANION GAP: 12 (ref 5–15)
BUN: 11 mg/dL (ref 8–23)
CO2: 21 mmol/L — AB (ref 22–32)
Calcium: 9.2 mg/dL (ref 8.9–10.3)
Chloride: 106 mmol/L (ref 98–111)
Creatinine, Ser: 0.68 mg/dL (ref 0.44–1.00)
GFR calc Af Amer: >60 mL/min (ref >60)
GLUCOSE: 124 mg/dL — AB (ref 70–99)
POTASSIUM: 3.3 mmol/L — AB (ref 3.5–5.1)
Sodium: 139 mmol/L (ref 135–145)

## 2018-05-06 LAB — LIPID PANEL
Cholesterol: 172 mg/dL (ref 0–200)
HDL: 51 mg/dL (ref >40)
LDL CALC: 103 mg/dL — AB (ref 0–99)
Total CHOL/HDL Ratio: 3.4 RATIO
Triglycerides: 89 mg/dL (ref <150)
VLDL: 18 mg/dL (ref 0–40)

## 2018-05-06 LAB — HEMOGLOBIN A1C
HEMOGLOBIN A1C: 4.8 % (ref 4.8–5.6)
MEAN PLASMA GLUCOSE: 91.06 mg/dL

## 2018-05-06 LAB — MRSA PCR SCREENING: MRSA by PCR: NEGATIVE

## 2018-05-06 MED ORDER — SODIUM CHLORIDE 0.9 % IV SOLN
INTRAVENOUS | Status: DC
  Administered 2018-05-06 – 2018-05-07 (×3): via INTRAVENOUS

## 2018-05-06 MED ORDER — ACETAMINOPHEN 325 MG PO TABS
650.0000 mg | ORAL_TABLET | ORAL | Status: DC | PRN
  Administered 2018-05-07 – 2018-05-08 (×6): 650 mg via ORAL
  Filled 2018-05-06 (×6): qty 2

## 2018-05-06 MED ORDER — POTASSIUM CHLORIDE 20 MEQ/15ML (10%) PO SOLN: 20.0000 meq | Freq: Two times a day (BID) | ORAL | Status: DC

## 2018-05-06 MED ORDER — ONDANSETRON HCL 4 MG/2ML IJ SOLN
4.0000 mg | Freq: Once | INTRAMUSCULAR | Status: AC
  Administered 2018-05-06: 4 mg via INTRAVENOUS
  Filled 2018-05-06: qty 2

## 2018-05-06 MED ORDER — ONDANSETRON HCL 4 MG/2ML IJ SOLN
INTRAMUSCULAR | Status: DC | PRN
  Administered 2018-05-06: 4 mg via INTRAVENOUS

## 2018-05-06 MED ORDER — CLEVIDIPINE BUTYRATE 0.5 MG/ML IV EMUL
INTRAVENOUS | Status: AC
  Filled 2018-05-06: qty 50

## 2018-05-06 MED ORDER — POTASSIUM CHLORIDE CRYS ER 20 MEQ PO TBCR
20.0000 meq | EXTENDED_RELEASE_TABLET | Freq: Two times a day (BID) | ORAL | Status: AC
  Administered 2018-05-06 – 2018-05-07 (×4): 20 meq via ORAL
  Filled 2018-05-06 (×4): qty 1

## 2018-05-06 MED ORDER — CLEVIDIPINE BUTYRATE 0.5 MG/ML IV EMUL
0.0000 mg/h | INTRAVENOUS | Status: DC
  Administered 2018-05-06: 2 mg/h via INTRAVENOUS
  Administered 2018-05-06: 1 mg/h via INTRAVENOUS
  Administered 2018-05-06: 3 mg/h via INTRAVENOUS
  Administered 2018-05-06: 5 mg/h via INTRAVENOUS
  Administered 2018-05-07: 10 mg/h via INTRAVENOUS
  Administered 2018-05-07: 5 mg/h via INTRAVENOUS
  Filled 2018-05-06 (×6): qty 50

## 2018-05-06 MED ORDER — TICAGRELOR 90 MG PO TABS
90.0000 mg | ORAL_TABLET | Freq: Two times a day (BID) | ORAL | Status: DC
  Administered 2018-05-06 – 2018-05-07 (×3): 90 mg via ORAL
  Filled 2018-05-06 (×3): qty 1

## 2018-05-06 MED ORDER — ATROPINE SULFATE 1 MG/10ML IJ SOSY
PREFILLED_SYRINGE | INTRAMUSCULAR | Status: AC
  Filled 2018-05-06: qty 10

## 2018-05-06 MED ORDER — ASPIRIN 81 MG PO CHEW
81.0000 mg | CHEWABLE_TABLET | Freq: Every day | ORAL | Status: DC
  Administered 2018-05-06 – 2018-05-10 (×5): 81 mg via ORAL
  Filled 2018-05-06 (×5): qty 1

## 2018-05-06 MED ORDER — ATORVASTATIN CALCIUM 40 MG PO TABS
40.0000 mg | ORAL_TABLET | Freq: Every day | ORAL | Status: DC
  Administered 2018-05-06 – 2018-05-09 (×4): 40 mg via ORAL
  Filled 2018-05-06 (×4): qty 1

## 2018-05-06 MED ORDER — IOHEXOL 300 MG/ML  SOLN
75.0000 mL | Freq: Once | INTRAMUSCULAR | Status: AC | PRN
  Administered 2018-05-06: 75 mL via INTRAVENOUS

## 2018-05-06 MED ORDER — ACETAMINOPHEN 650 MG RE SUPP: 650.0000 mg | RECTAL | Status: DC | PRN

## 2018-05-06 MED ORDER — TICAGRELOR 90 MG PO TABS: 90.0000 mg | ORAL_TABLET | Freq: Two times a day (BID) | ORAL | Status: DC

## 2018-05-06 MED ORDER — MORPHINE SULFATE (PF) 2 MG/ML IV SOLN
1.0000 mg | Freq: Once | INTRAVENOUS | Status: AC
  Administered 2018-05-06: 1 mg via INTRAVENOUS
  Filled 2018-05-06: qty 1

## 2018-05-06 MED ORDER — ASPIRIN 81 MG PO CHEW: 81.0000 mg | CHEWABLE_TABLET | Freq: Every day | ORAL | Status: DC

## 2018-05-06 MED ORDER — SUGAMMADEX SODIUM 200 MG/2ML IV SOLN
INTRAVENOUS | Status: DC | PRN
  Administered 2018-05-06: 200 mg via INTRAVENOUS

## 2018-05-06 MED ORDER — ACETAMINOPHEN 160 MG/5ML PO SOLN: 650.0000 mg | ORAL | Status: DC | PRN

## 2018-05-06 NOTE — Progress Notes (Signed)
Edilia Bo, RN from [facility] came to assess R sheath site. She removed the sheath at 0317 and applied pressure.  At 0353 T. Nichols applied the FemStop to her right groin. Pulses frequently checked and confirmed the entire time Fem Stop was on.   Fem Stop removed at 0440 by myself. No oozing or bleeding present. Hematoma palpated around catheter entry site. Pressure dressing applied. Pulses equal bilaterally.   Will continue to monitor closely.

## 2018-05-06 NOTE — Progress Notes (Signed)
Patient ID: Deanna Guerrero, female   DOB: 03/17/1941, 77 y.o.   MRN: [id number] INR. 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 CT Brain  NO ICH .ASPECTS 10  CTA occluded dominant Inf division  RT MCA. Endovascular revascularization discussed with daughter..Reasons ,risks,procedure alternatives reviewed with daughter.Risks of ICH of 10 %,worsening neuro deficit,vent dependency, death ,inability to revascularize ,vascular injury all discussed.Questions answered to  daughters understanding.Informed witnessed consent obtained  for endovascular revascularization. S.Marilyn Nihiser MD

## 2018-05-06 NOTE — Progress Notes (Signed)
Upon arrival to ICU, Right femoral sheath bleeding. Dr. Estanislado Pandy instructed to hold pressure for 15 minutes and reassess.

## 2018-05-06 NOTE — Transfer of Care (Addendum)
Immediate Anesthesia Transfer of Care Note  Patient: Deanna Guerrero  Procedure(s) Performed: RADIOLOGY WITH ANESTHESIA (N/A )  Patient Location: PACU  Anesthesia Type:General  Level of Consciousness: awake  Airway & Oxygen Therapy: Patient Spontanous Breathing and Patient connected to nasal cannula oxygen  Post-op Assessment: Report given to RN and Post -op Vital signs reviewed and stable  Post vital signs: Reviewed and stable  Last Vitals:  Vitals Value Taken Time  BP 162/85 05/06/2018 0043 PM  Temp 97.5   Pulse 76 05/06/2018  2:51 PM  Resp 18 05/06/2018  2:51 PM  SpO2 93 % 05/06/2018  2:51 PM  Vitals shown include unvalidated device data.  Last Pain:  Vitals:   05/06/18 1200  TempSrc: Oral  PainSc:       Patients Stated Pain Goal: 0 (61/84/85 [id number])  Complications: No apparent anesthesia complications

## 2018-05-06 NOTE — Progress Notes (Signed)
After applying pressure for 15 minutes, sheath dressing redressed and site still bleeding. Applied pressure for 15 minutes, again.   Site still continues to bleed. Dr. Estanislado Pandy called. Instructed to have a 2H RN d/c sheath and apply fem stop.  Also notified Dr. Estanislado Pandy that patient had emesis, despite receiving zofran. Pt denying headache and still alert.

## 2018-05-06 NOTE — Evaluation (Signed)
Speech Language Pathology Evaluation Patient Details Name: Deanna Guerrero MRN: [id number] DOB: [birth year] Today's Date: 05/06/2018 Time: 0626-9485 SLP Time Calculation (min) (ACUTE ONLY): 16 min  Problem List:  Patient Active Problem List   Diagnosis Date Noted  . Middle cerebral artery embolism, right 05/06/2018  . Acute right arterial ischemic stroke, middle cerebral artery (MCA) ([place]) 05/05/2018   Past Medical History:  Past Medical History:  Diagnosis Date  . A-fib ([place])   . Pacemaker    Past Surgical History:  Past Surgical History:  Procedure Laterality Date  . ABDOMINAL HYSTERECTOMY     HPI:  Deanna Guerrero is an 78 y.o. female with past medical history of atrial fibrillation on Eliquis, recent pacemaker placement presents to the emergency room as a code stroke for left-sided weakness and neglect and facial droop.  She was last seen normal around 7 PM by her family.  Around 715 patient had a fall and noted to be weak on the left side.  EMS was called who noted the patient had a facial droop neglect left-sided weakness and called code stroke.  Blood pressure was 462 systolic on arrival.  Patient on Eliquis at home, denies missing medications.   CT head showed no acute abnormality.  She was not a TPA candidate as she was on anticoagulation.  CT angiogram revealed a right M1-M2 occlusion, and IR was activated.    Assessment / Plan / Recommendation Clinical Impression  Cognitive/linguistic and motor speech screen was completed.  The patient's speech was clear and easy to understand. No discernible dysarthria or apraxia noted.  She achieved a score of 29/30 on the Mini Mental State Exam suggesting functional skills.  She was oriented to person, place, time and situation. She had good attention to task.  Immediate and delayed recall were adequate.  She was able to name objects, follow a 3 step direction, repeat a short phrase, read/comprehend an short sentence, write a short sentence  and copy a design. She was able to easily converse.  In addition, she was able to provide logical solutions to simple problems.  Given this ST follow up during acute stay is not indicated.      SLP Assessment  SLP Recommendation/Assessment: Patient does not need any further Speech Lanaguage Pathology Services SLP Visit Diagnosis: Cognitive communication deficit (R41.841)    Follow Up Recommendations  None          SLP Evaluation Cognition  Overall Cognitive Status: Within Functional Limits for tasks assessed Arousal/Alertness: Awake/alert Orientation Level: Oriented X4 Attention: Sustained Sustained Attention: Appears intact Memory: Appears intact Awareness: Appears intact Problem Solving: Appears intact       Comprehension  Auditory Comprehension Overall Auditory Comprehension: Appears within functional limits for tasks assessed Commands: Within Functional Limits Conversation: Simple Reading Comprehension Reading Status: Within funtional limits    Expression Expression Primary Mode of Expression: Verbal Verbal Expression Overall Verbal Expression: Appears within functional limits for tasks assessed Initiation: No impairment Automatic Speech: Name;Social Response Level of Generative/Spontaneous Verbalization: Conversation Repetition: No impairment Naming: No impairment Pragmatics: No impairment Non-Verbal Means of Communication: Not applicable Written Expression Dominant Hand: Right Written Expression: Within Functional Limits   Oral / Motor  Motor Speech Overall Motor Speech: Appears within functional limits for tasks assessed Respiration: Within functional limits Phonation: Normal Resonance: Within functional limits Articulation: Within functional limitis Intelligibility: Intelligible Motor Planning: Witnin functional limits Motor Speech Errors: Not applicable   GO  Deanna Flatten, MA, [facility] SLP [phone]  Deanna Guerrero 05/06/2018, 11:53 AM

## 2018-05-06 NOTE — Progress Notes (Signed)
  Echocardiogram 2D Echocardiogram has been performed.  Deanna Guerrero 05/06/2018, 3:19 PM

## 2018-05-06 NOTE — Sedation Documentation (Signed)
SBAR given to [employer], Therapist, sports at bedside. All questions answered. Groin and pulses per flowsheet.

## 2018-05-06 NOTE — Anesthesia Postprocedure Evaluation (Signed)
Anesthesia Post Note  Patient: Kripa Foskey  Procedure(s) Performed: RADIOLOGY WITH ANESTHESIA (N/A )     Patient location during evaluation: PACU Anesthesia Type: General Level of consciousness: awake and alert Pain management: pain level controlled Vital Signs Assessment: post-procedure vital signs reviewed and stable Respiratory status: spontaneous breathing, nonlabored ventilation, respiratory function stable and patient connected to nasal cannula oxygen Cardiovascular status: blood pressure returned to baseline and stable Postop Assessment: no apparent nausea or vomiting Anesthetic complications: no    Last Vitals:  Vitals:   05/06/18 0245 05/06/18 0300  BP:  121/64  Pulse: 64 67  Resp: 15 18  Temp:    SpO2: 99% 100%    Last Pain:  Vitals:   05/06/18 0149  PainSc: 0-No pain                 Moriyah Byington,W. EDMOND

## 2018-05-06 NOTE — Progress Notes (Signed)
STROKE TEAM PROGRESS NOTE   HISTORY OF PRESENT ILLNESS (per record) Deanna Guerrero is an 77 y.o. female with past medical history of atrial fibrillation on Eliquis, recent pacemaker placement presents to the emergency room as a code stroke for left-sided weakness, neglect and facial droop.  She was last seen normal around 7 PM by her family.  Around 715 patient had a fall and noted to be weak on the left side.  EMS was called who noted the patient had a facial droop neglect left-sided weakness and called code stroke.  Blood pressure was 540 systolic on arrival.  Patient on Eliquis at home, denies missing medications.  CT head showed no acute abnormality.  She was not a TPA candidate as she was on anticoagulation.  CT angiogram revealed a right M1-M2 occlusion, and IR was activated.  Date last known well: 10.12.19 Time last known well: 7pm tPA Given: no, on Eliquis NIHSS: 11 Baseline MRS 0   SUBJECTIVE (INTERVAL HISTORY) Her family is not at bedside. She is doing extremely well, moving all extremities, following commands, she is alert and pleasant and lovely. She is c/o pain and we will give her low dose x 1 morphine since she is NPO.    OBJECTIVE Vitals:   05/06/18 0800 05/06/18 0900 05/06/18 1000 05/06/18 1200  BP: (!) 117/57 (!) 107/58 (!) 107/56   Pulse: 73 74 73   Resp: 15 17 14    Temp: 98.3 F (36.8 C)   98.3 F (36.8 C)  TempSrc: Oral   Oral  SpO2: 100% 100% 92%   Height:        CBC:  Recent Labs  Lab 05/05/18 2031 05/06/18 0609  WBC 8.9 10.6*  NEUTROABS 6.9 9.5*  HGB 9.8* 8.0*  HCT 30.1* 24.3*  MCV 80.9 80.5  PLT 196 981    Basic Metabolic Panel:  Recent Labs  Lab 05/05/18 2031 05/06/18 0609  NA 137 139  K 3.1* 3.3*  CL 105 106  CO2 23 21*  GLUCOSE 107* 124*  BUN 14 11  CREATININE 0.83 0.68  CALCIUM 9.6 9.2    Lipid Panel:     Component Value Date/Time   CHOL 172 05/06/2018 0609   TRIG 89 05/06/2018 0609   HDL 51 05/06/2018 0609    CHOLHDL 3.4 05/06/2018 0609   VLDL 18 05/06/2018 0609   LDLCALC 103 (H) 05/06/2018 0609   HgbA1c:  Lab Results  Component Value Date   HGBA1C 4.8 05/06/2018   Urine Drug Screen: No results found for: LABOPIA, COCAINSCRNUR, LABBENZ, AMPHETMU, THCU, LABBARB  Alcohol Level No results found for: ETH  IMAGING   Ct Angio Head W Or Wo Contrast Ct Angio Neck W Or Wo Contrast 05/05/2018 IMPRESSION:  No extracranial stenosis or dissection. BILATERAL carotid atheromatous change. 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 Innumerable subcentimeter pulmonary nodules, which could represent infection or metastatic disease. CT chest with contrast could provide additional information.    Ct Head Code Stroke Wo Contrast 05/05/2018 IMPRESSION:  1. Atrophy and small vessel disease. No acute intracranial findings. No signs of large vessel occlusion.  2. ASPECTS is 10.    Cerebral Angiogram 05/06/2018 S/P RT common carotid arteriogram followed by endovascular revascularization of RT MCA INF division M2 branch with x 3 passes with embotrap 79mm x 33 mm retriever device and X1 pass with  solitaire Fr 62mm x 40 mm retriever device achieving a TICI 2 b revascularization ,with rescue stent placement for reocclusion achieving a  TICI 3 revascularization.    Transthoracic Echocardiogram - pending 00/00/00    PHYSICAL EXAM Blood pressure (!) 107/56, pulse 73, temperature 98.3 F (36.8 C), temperature source Oral, resp. rate 14, height 5\' 3"  (1.6 m), SpO2 92 %.  PHYSICAL EXAM Physical exam: Exam:  PHYSICAL EXAM elderly african [place] lady who is awake and alert. . Afebrile. Head is nontraumatic. Neck is supple without bruit. Cardiac exam no murmur or gallop. Lungs are clear to auscultation. Distal pulses are well felt. Neurological Exam :She is alert and oriented to self, place, month, year, appears to be a good historian,  Following commands, moves all extremities to command  without drift, sensation intact, EOMI, PERRL, blinks to threat bilat, face symmetric, tongue midline,spontaneous movements x4, toes equiv, no clonus.   Gait: Unable to test due to 24 hour restriction on walking s/p thrombectomy      ASSESSMENT/PLAN [name] is a 77 y.o. female with history of atrial fibrillation on Eliquis, recent pacemaker placement  presenting with left-sided weakness, neglect and facial droop. She did not receive IV t-PA due to anticoagulation. S/P RT MCA INF division M2 branch mechanical thrombectomy and rescue stent  Stroke:    Resultant  Improved no focal deficits noted on exam today  CT head - Atrophy and small vessel disease.  MRI head - recent ppm placement  MRA head - recent ppm placement  Carotid Doppler - CTA neck performed - carotid dopplers not indicated.  2D Echo - pending  LDL - 103  HgbA1c - 4.8  VTE prophylaxis - SCDs  Diet - NPO  Eliquis (apixaban) daily prior to admission, now on aspirin 81 mg daily and Brilinta 90 mg BID  Patient counseled to be compliant with her antithrombotic medications  Ongoing aggressive stroke risk factor management  Therapy recommendations:  pending  Disposition:  Pending  Hypertension  Stable - Cleviprex . Permissive hypertension (OK if < 220/120) but gradually normalize in 5-7 days . Long-term BP goal normotensive  Hyperlipidemia  Lipid lowering medication PTA:  none  LDL 103, goal < 70  Current lipid lowering medication: add Lipitor 40 mg daily  Continue statin at discharge   Other Stroke Risk Factors  Advanced age  Morbid Obesity,, recommend weight loss, diet and exercise as appropriate   Afib on Eliquis PTA  Other Active Problems  Innumerable subcentimeter pulmonary nodules could represent infection or metastatic disease. CT chest with contrast tomorrow when off bedrest.  Hypokalemia - 3.3 - supplement  Anemia - Hb 9.8 -> 8.0 - monitor for retroperitoneal  hematoma  Mild leukocytosis - 10.6  PLAN  MRI Brain pending: She has pacemaker and cannot have it will order ct head instead.  Chest CT with contrast  Continue stroke W/U  Daily labs for 3 days monitor above abnormalities  Start statin       Hospital day # 1  Personally examined patient and images, and have participated in and made any corrections needed to history, physical, neuro exam,assessment and plan as stated above.  I have personally obtained the history, evaluated lab date, reviewed imaging studies and agree with radiology interpretations.    Sarina Ill, MD Stroke Neurology      To contact Stroke Continuity provider, please refer to [email or web address] After hours, contact General Neurology

## 2018-05-06 NOTE — Progress Notes (Signed)
Referring Physician(s): * No referring provider recorded for this case *  Supervising Physician: Luanne Bras  Patient Status:  [facility] - In-pt  Chief Complaint: R MCA occlusion  Subjective: Up in bed eating lunch.  Conversant and pleasant.  Grateful for care and recovery.   Allergies: Patient has no known allergies.  Medications: Prior to Admission medications   Medication Sig Start Date End Date Taking? Authorizing Provider  amLODipine (NORVASC) 5 MG tablet Take 5 mg by mouth daily.   Yes [provider]  apixaban (ELIQUIS) 5 MG TABS tablet Take 5 mg by mouth 2 (two) times daily.   Yes [provider]  diltiazem (CARDIZEM SR) 120 MG 12 hr capsule Take 120 mg by mouth daily.   Yes [provider]  flecainide (TAMBOCOR) 150 MG tablet Take 150 mg by mouth 2 (two) times daily.   Yes [provider]  fluticasone (FLONASE) 50 MCG/ACT nasal spray Place 1 spray into both nostrils daily.   Yes [provider]  guaiFENesin (MUCINEX) 600 MG 12 hr tablet Take 600 mg by mouth every 12 (twelve) hours. **For 14 days**   Yes [provider]  hydrochlorothiazide (HYDRODIURIL) 25 MG tablet Take 25 mg by mouth daily.   Yes [provider]  metoprolol succinate (TOPROL-XL) 25 MG 24 hr tablet Take 25 mg by mouth daily.   Yes [provider]  omeprazole (PRILOSEC) 20 MG capsule Take 20 mg by mouth daily.   Yes [provider]  potassium chloride SA (K-DUR,KLOR-CON) 20 MEQ tablet Take 20 mEq by mouth 2 (two) times daily.   Yes [provider]  pravastatin (PRAVACHOL) 80 MG tablet Take 80 mg by mouth daily.   Yes [provider]  tiZANidine (ZANAFLEX) 2 MG tablet Take 2 mg by mouth 3 (three) times daily.   Yes [provider]  traMADol (ULTRAM) 50 MG tablet Take 50 mg by mouth 2 (two) times daily as needed for moderate pain.   Yes [provider]     Vital Signs: BP 126/61    Pulse 90   Temp 98.3 F (36.8 C) (Oral)   Resp (!) 26   Ht 5\' 3"  (1.6 m)   SpO2 94%   Physical Exam  NAD, alert Neuro:  Alert, oriented, EOMs intact. Strength 5/5 bilateral upper and lower extremities, follows commands, eating without difficulty, facial symmetry, tongue midline Groin: soft, intact.  Clean dressing in place.  No evidence of further bleeding, hematoma, or pseudoaneurysm  Imaging: Ct Angio Head W Or Wo Contrast  Result Date: 05/05/2018 CLINICAL DATA:  LEFT-sided weakness. EXAM: CT ANGIOGRAPHY HEAD AND NECK TECHNIQUE: Multidetector CT imaging of the head and neck was performed using the standard protocol during bolus administration of intravenous contrast. Multiplanar CT image reconstructions and MIPs were obtained to evaluate the vascular anatomy. Carotid stenosis measurements (when applicable) are obtained utilizing NASCET criteria, using the distal internal carotid diameter as the denominator. CONTRAST:  84mL ISOVUE-370 IOPAMIDOL (ISOVUE-370) INJECTION 76% COMPARISON:  Code stroke CT earlier today. FINDINGS: CTA NECK FINDINGS Aortic arch: Standard branching. Imaged portion shows no evidence of aneurysm or dissection. No significant stenosis of the major arch vessel origins. Right carotid system: No evidence of dissection, stenosis (50% or greater) or occlusion. Minor atheromatous change. Left carotid system: No evidence of dissection, stenosis (50% or greater) or occlusion. Minor atheromatous change. Vertebral arteries: Codominant. No evidence of dissection, stenosis (50% or greater) or occlusion. Skeleton: Spondylosis.  Poor dentition. Other neck: No  masses.  Airway patent. Upper chest: Widespread BILATERAL pulmonary nodules, subcentimeter, too numerous to count, slightly larger and more numerous to the LEFT. There is mediastinal adenopathy, incompletely evaluated. These nodules could represent diffuse infection or widespread metastatic disease. Small RIGHT effusion. Aortic  atherosclerosis is noted. Dual lead pacemaker enters the heart from LEFT subclavian approach. Review of the MIP images confirms the above findings CTA HEAD FINDINGS Anterior circulation: Minor calcific atheromatous change in the BILATERAL carotid siphons. There is a RIGHT MCA M2 posterior division occlusion, 3 mm from the bifurcation. No M1 stenosis on the RIGHT. Both anterior cerebral arteries are patent, LEFT dominant. No LEFT MCA disease of significance. Posterior circulation: No significant stenosis, proximal occlusion, aneurysm, or vascular malformation. Venous sinuses: As permitted by contrast timing, patent. Anatomic variants: RIGHT fetal PCA. Delayed phase: Not performed. Review of the MIP images confirms the above findings IMPRESSION: No extracranial stenosis or dissection. BILATERAL carotid atheromatous change. 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 Innumerable subcentimeter pulmonary nodules, which could represent infection or metastatic disease. CT chest with contrast could provide additional information. These results were called by telephone at the time of interpretation on 05/05/2018 at 8:40 pm to Dr. Samara Snide , who verbally acknowledged these results. Electronically Signed   By: Staci Righter M.D.   On: 05/05/2018 20:51   Ct Head Wo Contrast  Result Date: 05/06/2018 CLINICAL DATA:  Posterior inferior division RIGHT MCA M2 occlusion. S/P RT common carotid arteriogram followed by endovascular revascularization of RT MCA INF division M2 branch with x 3 passes with embotrap 64mm x 33 mm retriever device and X1 pass with solitaire Fr 28mm x 40 mm retriever device achieving a TICI 2 b revascularization ,with rescue stent placement for reocclusion achieving a TICI 3 revascularization. EXAM: CT HEAD WITHOUT CONTRAST TECHNIQUE: Contiguous axial images were obtained from the base of the skull through the vertex without intravenous contrast. COMPARISON:  Multiple prior examinations,  05/05/2018. FINDINGS: Brain: Subarachnoid hemorrhage is observed in the RIGHT sylvian fissure, moderately extensive without visible hemorrhagic infarction or parenchymal hematoma. No subdural collection. Rescue stent is observed in the RIGHT MCA extending across the M1 to M2 segments. Slight hypoattenuation of RIGHT temporal lobe, but normal appearing frontal and parietal lobes. Old LEFT occipital infarct. Hypoattenuation of white matter, likely small vessel disease. Vascular: No visible large vessel occlusion or reocclusion. Skull: Intact Sinuses/Orbits: No acute findings Other: None IMPRESSION: RIGHT sylvian fissure subarachnoid hemorrhage status post endovascular treatment for RIGHT MCA M2 occlusion. This corresponds to [place] bleeding classification 3C. No evidence for hemorrhagic infarction or parenchymal hematoma. Developing hypoattenuation, RIGHT temporal lobe suspected early cytotoxic edema. Electronically Signed   By: Staci Righter M.D.   On: 05/06/2018 13:37   Ct Angio Neck W Or Wo Contrast  Result Date: 05/05/2018 CLINICAL DATA:  LEFT-sided weakness. EXAM: CT ANGIOGRAPHY HEAD AND NECK TECHNIQUE: Multidetector CT imaging of the head and neck was performed using the standard protocol during bolus administration of intravenous contrast. Multiplanar CT image reconstructions and MIPs were obtained to evaluate the vascular anatomy. Carotid stenosis measurements (when applicable) are obtained utilizing NASCET criteria, using the distal internal carotid diameter as the denominator. CONTRAST:  28mL ISOVUE-370 IOPAMIDOL (ISOVUE-370) INJECTION 76% COMPARISON:  Code stroke CT earlier today. FINDINGS: CTA NECK FINDINGS Aortic arch: Standard branching. Imaged portion shows no evidence of aneurysm or dissection. No significant stenosis of the major arch vessel origins. Right carotid system: No evidence of dissection, stenosis (50% or greater) or occlusion.  Minor atheromatous change. Left carotid system: No  evidence of dissection, stenosis (50% or greater) or occlusion. Minor atheromatous change. Vertebral arteries: Codominant. No evidence of dissection, stenosis (50% or greater) or occlusion. Skeleton: Spondylosis.  Poor dentition. Other neck: No masses.  Airway patent. Upper chest: Widespread BILATERAL pulmonary nodules, subcentimeter, too numerous to count, slightly larger and more numerous to the LEFT. There is mediastinal adenopathy, incompletely evaluated. These nodules could represent diffuse infection or widespread metastatic disease. Small RIGHT effusion. Aortic atherosclerosis is noted. Dual lead pacemaker enters the heart from LEFT subclavian approach. Review of the MIP images confirms the above findings CTA HEAD FINDINGS Anterior circulation: Minor calcific atheromatous change in the BILATERAL carotid siphons. There is a RIGHT MCA M2 posterior division occlusion, 3 mm from the bifurcation. No M1 stenosis on the RIGHT. Both anterior cerebral arteries are patent, LEFT dominant. No LEFT MCA disease of significance. Posterior circulation: No significant stenosis, proximal occlusion, aneurysm, or vascular malformation. Venous sinuses: As permitted by contrast timing, patent. Anatomic variants: RIGHT fetal PCA. Delayed phase: Not performed. Review of the MIP images confirms the above findings IMPRESSION: No extracranial stenosis or dissection. BILATERAL carotid atheromatous change. 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 Innumerable subcentimeter pulmonary nodules, which could represent infection or metastatic disease. CT chest with contrast could provide additional information. These results were called by telephone at the time of interpretation on 05/05/2018 at 8:40 pm to Dr. Samara Snide , who verbally acknowledged these results. Electronically Signed   By: Staci Righter M.D.   On: 05/05/2018 20:51   Dg Chest Port 1 View  Result Date: 05/06/2018 CLINICAL DATA:  Recent stroke EXAM:  PORTABLE CHEST 1 VIEW COMPARISON:  None. FINDINGS: Cardiac shadow is at the upper limits of normal in size. Pacing device is noted. Diffuse vascular congestion and patchy densities are noted bilaterally likely related to pulmonary edema. No focal confluent infiltrate is seen. No sizable effusion is noted. IMPRESSION: Diffuse pulmonary edema. Electronically Signed   By: Inez Catalina M.D.   On: 05/06/2018 10:31   Ct Head Code Stroke Wo Contrast  Result Date: 05/05/2018 CLINICAL DATA:  Code stroke.  LEFT-sided deficits. EXAM: CT HEAD WITHOUT CONTRAST TECHNIQUE: Contiguous axial images were obtained from the base of the skull through the vertex without intravenous contrast. COMPARISON:  None. FINDINGS: Brain: No evidence for acute infarction, hemorrhage, mass lesion, hydrocephalus, or extra-axial fluid. Generalized atrophy. Hypoattenuation of white matter, likely small vessel disease. Vascular: Calcification of the cavernous internal carotid arteries consistent with cerebrovascular atherosclerotic disease. No signs of intracranial large vessel occlusion. Skull: Calvarium intact.  No worrisome osseous lesion. Sinuses/Orbits: Dense lenticular opacities. No significant sinus fluid. Other: None. ASPECTS [facility] Stroke Program Early CT Score) - Ganglionic level infarction (caudate, lentiform nuclei, internal capsule, insula, M1-M3 cortex): 7 - Supraganglionic infarction (M4-M6 cortex): 3 Total score (0-10 with 10 being normal): 10 IMPRESSION: 1. Atrophy and small vessel disease. No acute intracranial findings. No signs of large vessel occlusion. 2. ASPECTS is 10. These results were communicated to Dr. Lorraine Lax at 8:25 pmon 10/12/2019by text page via the [facility] messaging system. Electronically Signed   By: Staci Righter M.D.   On: 05/05/2018 20:26    Labs:  CBC: Recent Labs    05/05/18 2009 05/05/18 2031 05/06/18 0609  WBC  --  8.9 10.6*  HGB 12.2 9.8* 8.0*  HCT 36.0 30.1* 24.3*  PLT  --  196 171     COAGS: Recent Labs  05/05/18 2031  INR 1.32  APTT 37*    BMP: Recent Labs    05/05/18 2009 05/05/18 2031 05/06/18 0609  NA 140 137 139  K 3.3* 3.1* 3.3*  CL 106 105 106  CO2  --  23 21*  GLUCOSE 109* 107* 124*  BUN 16 14 11   CALCIUM  --  9.6 9.2  CREATININE 0.70 0.83 0.68  GFRNONAA  --  >60 >60  GFRAA  --  >60 >60    LIVER FUNCTION TESTS: Recent Labs    05/05/18 2031  BILITOT 0.5  AST 26  ALT 17  ALKPHOS 153*  PROT 7.2  ALBUMIN 3.5    Assessment and Plan: R MCA CVA s/p revascularization 05/05/18 with stent placement.  Patient doing well this AM.  Unable to get MRI due to pacemaker, for CT today.  On dual antiplatelet with Brilinta 90 mg BID and aspirin 81 mg daily.  IR to follow.   Electronically Signed: Docia Barrier, PA 05/06/2018, 2:28 PM   I spent a total of 15 Minutes at the the patient's bedside AND on the patient's hospital floor or unit, greater than 50% of which was counseling/coordinating care for R MCA CVA

## 2018-05-06 NOTE — ED Triage Notes (Signed)
Pt arrived from home by [facility] for stroke symptoms, @ 1915 while at home alone pt fell after coming weak,pt reports she crawled to telephone and called her daughter.  Code stroke called @ 1941 by EMS, on arrival pt noted to have L sided facial droop, drooling noted on arrival,  L upper and lower extremity weakness.  Pt alert and able to follow commands

## 2018-05-06 NOTE — Procedures (Signed)
S/P RT common carotid arteriogram followed by endovascular revascularization of RT MCA INF division M2 branch with x 3 passes with embotrap 61mm x 33 mm retriever device and X1 pass with  solitaire Fr 29mm x 40 mm retriever device achieving a TICI 2 b revascularization ,with rescue stent placement for reocclusion achieving a TICI 3 revascularization.

## 2018-05-06 NOTE — Progress Notes (Signed)
Patient ID: Deanna Guerrero, female   DOB: [birth year], 77 y.o.   MRN: [id number] INR. Post procedure. Patient extubated without difficulty. Able to obey simple commands appropriately. Pupils 2 to 3 mm sluggish. No facial asymmetry. Tongue midline. Able to lift Lt UE against gravity. Able to bend Lt knee. Rt groin soft.No hematoma. Distal pulses palpable DPs and PTs in both feet. 70F sheath left in the RT groin due patient being on eliquis,and unable to place closure device because of arterial puncture at bifurcation.  S.Sharetha Newson MD

## 2018-05-06 NOTE — Progress Notes (Signed)
PT Cancellation Note  Patient Details Name: Ayse Mccartin MRN: [id number] DOB: 03/18/1941   Cancelled Treatment:    Reason Eval/Treat Not Completed: Active bedrest order(post sheath removal)   Roney Marion, PT  Acute Rehabilitation Services Pager [phone] Office [phone]    [place] 05/06/2018, 7:25 AM

## 2018-05-07 ENCOUNTER — Other Ambulatory Visit: Payer: Self-pay

## 2018-05-07 ENCOUNTER — Encounter (HOSPITAL_COMMUNITY): Payer: Self-pay | Admitting: Interventional Radiology

## 2018-05-07 ENCOUNTER — Telehealth: Payer: Self-pay | Admitting: Emergency Medicine

## 2018-05-07 DIAGNOSIS — R0902 Hypoxemia: Secondary | ICD-10-CM

## 2018-05-07 DIAGNOSIS — I63511 Cerebral infarction due to unspecified occlusion or stenosis of right middle cerebral artery: Secondary | ICD-10-CM

## 2018-05-07 DIAGNOSIS — R918 Other nonspecific abnormal finding of lung field: Secondary | ICD-10-CM

## 2018-05-07 LAB — BASIC METABOLIC PANEL
ANION GAP: 7 (ref 5–15)
BUN: 11 mg/dL (ref 8–23)
CO2: 22 mmol/L (ref 22–32)
Calcium: 8.9 mg/dL (ref 8.9–10.3)
Chloride: 112 mmol/L — ABNORMAL HIGH (ref 98–111)
Creatinine, Ser: 0.67 mg/dL (ref 0.44–1.00)
GLUCOSE: 100 mg/dL — AB (ref 70–99)
Potassium: 3.2 mmol/L — ABNORMAL LOW (ref 3.5–5.1)
SODIUM: 141 mmol/L (ref 135–145)

## 2018-05-07 LAB — CBC
HCT: 19.1 % — ABNORMAL LOW (ref 36.0–46.0)
HEMOGLOBIN: 6.1 g/dL — AB (ref 12.0–15.0)
MCH: 26.2 pg (ref 26.0–34.0)
MCHC: 31.9 g/dL (ref 30.0–36.0)
MCV: 82 fL (ref 80.0–100.0)
Platelets: 143 10*3/uL — ABNORMAL LOW (ref 150–400)
RBC: 2.33 MIL/uL — ABNORMAL LOW (ref 3.87–5.11)
RDW: 17.1 % — ABNORMAL HIGH (ref 11.5–15.5)
WBC: 8.5 10*3/uL (ref 4.0–10.5)
nRBC: 0 % (ref 0.0–0.2)

## 2018-05-07 LAB — PREPARE RBC (CROSSMATCH)

## 2018-05-07 LAB — ABO/RH: ABO/RH(D): O POS

## 2018-05-07 LAB — HEMOGLOBIN AND HEMATOCRIT, BLOOD
HCT: 18.5 % — ABNORMAL LOW (ref 36.0–46.0)
Hemoglobin: 5.8 g/dL — CL (ref 12.0–15.0)

## 2018-05-07 MED ORDER — HEPARIN (PORCINE) IN NACL 100-0.45 UNIT/ML-% IJ SOLN
950.0000 [IU]/h | INTRAMUSCULAR | Status: DC
  Administered 2018-05-07 – 2018-05-08 (×2): 800 [IU]/h via INTRAVENOUS
  Filled 2018-05-07 (×2): qty 250

## 2018-05-07 MED ORDER — SODIUM CHLORIDE 0.9% IV SOLUTION: Freq: Once | INTRAVENOUS | Status: DC

## 2018-05-07 MED ORDER — METOPROLOL TARTRATE 5 MG/5ML IV SOLN: 2.5000 mg | INTRAVENOUS | Status: DC | PRN

## 2018-05-07 MED ORDER — FLECAINIDE ACETATE 50 MG PO TABS
150.0000 mg | ORAL_TABLET | Freq: Two times a day (BID) | ORAL | Status: DC
  Administered 2018-05-07 – 2018-05-10 (×7): 150 mg via ORAL
  Filled 2018-05-07 (×8): qty 1

## 2018-05-07 MED ORDER — METOPROLOL SUCCINATE ER 25 MG PO TB24
25.0000 mg | ORAL_TABLET | Freq: Every day | ORAL | Status: DC
  Administered 2018-05-07 – 2018-05-10 (×4): 25 mg via ORAL
  Filled 2018-05-07 (×4): qty 1

## 2018-05-07 MED ORDER — DILTIAZEM HCL ER 60 MG PO CP12
120.0000 mg | ORAL_CAPSULE | Freq: Every day | ORAL | Status: DC
  Administered 2018-05-07 – 2018-05-10 (×4): 120 mg via ORAL
  Filled 2018-05-07 (×4): qty 2

## 2018-05-07 NOTE — Consult Note (Addendum)
NAME:  Deanna Guerrero, MRN:  [id number], DOB:  01/06/1941, LOS: 2 ADMISSION DATE:  05/05/2018, CONSULTATION DATE:  05/07/2018 REFERRING MD:  Neurology - Leonie Man, CHIEF COMPLAINT:  Abnormal chest CT   Brief History   77 year old with history of a-fib on eliquis who presents with left sided weakness, neglect and a facial droop.  The patient had a stent placed and needs to be on anticoagulation.  A CT of the chest was done as a response to an abnormal CXR and patient was found to have multiple canonball lesions in the lungs and mediastinum and PCCM was consulted.  Upon interviewing the patient, denies smoking, weight loss or hemoptysis.  Discussed with Dr. Leonie Man.  Past Medical History  CVA, a-fib, HTN and pacer placement  Significant Hospital Events   CVA s/p stent placement 10/12  Consults: date of consult/date signed off & final recs:  PCCM for lung masses 10/19  Procedures (surgical and bedside):  Stent placement via NIR  Significant Diagnostic Tests:  Chest CT 10/13 with Vaillancourt ball lesions  Micro Data:  N/A  Antimicrobials:  N/A   Subjective:  Feels better, no events, no new complaints.  Objective   Blood pressure (!) 140/58, pulse 87, temperature 98.6 F (37 C), temperature source Oral, resp. rate 20, height 5\' 3"  (1.6 m), SpO2 96 %.        Intake/Output Summary (Last 24 hours) at 05/07/2018 1425 Last data filed at 05/07/2018 1301 Gross per 24 hour  Intake 1902 ml  Output 325 ml  Net 1577 ml   There were no vitals filed for this visit.  Examination: General: Chronically ill appearing, morbidly obese female, NAD HENT: [place]/AT, PERRL, EOM-I and MMM Lungs: CTA bilaterally Cardiovascular: RRR, Nl S1/S2 and -M/R/G Abdomen: Soft, NT, ND and +BS Extremities: -edema and -tenderness Neuro: Weak on the left but otherwise alert and interactive Skin: intact  I reviewed chest CT myself, diffuse canon ball lesions  Resolved Hospital Problem list   CVA  Assessment &  Plan:  77 year old female presenting with a  CVA s/p NIR and stent placement on anti-coagulation.  Admission CXR was abnormal and a CT of the chest was performed that showed diffuse [place] ball lesions.  Discussed with neurology and PCCM-NP.   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  - Given location of masses would recommend evaluation by IR to see if a CT guided biopsy of safe, unsure if they can still do on anti-platelet agents.    - If IR is unable to do then recommend f/u in the office for evaluation on when to be able to get off the anti-platelet agents and safely perform a bronchoscopy.  May need navigation given locations of lesions.  If that is the case please arrange for f/u with Dr. Lamonte Sakai 4 wks after discharge  - Check AFP, CEA and CA 19-9.  - Will need PET scan prior to visit with pulmonary  CVA:  - Monitor closely for airway protection  Hypoxemia:  - Titrate O2 for sat of 88-92%  - Anticipate will be able to get off O2 without difficulty  PCCM will sign off, please call back if needed.  Disposition / Summary of Today's Plan 05/07/18   As above  Labs   CBC: Recent Labs  Lab 05/05/18 2009 05/05/18 2031 05/06/18 0609 05/07/18 0457 05/07/18  0650  WBC  --  8.9 10.6* 8.5  --   NEUTROABS  --  6.9 9.5*  --   --   HGB 12.2 9.8* 8.0* 6.1* 5.8*  HCT 36.0 30.1* 24.3* 19.1* 18.5*  MCV  --  80.9 80.5 82.0  --   PLT  --  196 171 143*  --     Basic Metabolic Panel: Recent Labs  Lab 05/05/18 2009 05/05/18 2031 05/06/18 0609 05/07/18 0457  NA 140 137 139 141  K 3.3* 3.1* 3.3* 3.2*  CL 106 105 106 112*  CO2  --  23 21* 22  GLUCOSE 109* 107* 124* 100*  BUN 16 14 11 11   CREATININE 0.70 0.83 0.68 0.67  CALCIUM  --  9.6 9.2 8.9   GFR: CrCl cannot be calculated (Unknown ideal weight.). Recent Labs  Lab 05/05/18 2031  05/06/18 0609 05/07/18 0457  WBC 8.9 10.6* 8.5    Liver Function Tests: Recent Labs  Lab 05/05/18 2031  AST 26  ALT 17  ALKPHOS 153*  BILITOT 0.5  PROT 7.2  ALBUMIN 3.5   No results for input(s): LIPASE, AMYLASE in the last 168 hours. No results for input(s): AMMONIA in the last 168 hours.  ABG    Component Value Date/Time   TCO2 24 05/05/2018 2009     Coagulation Profile: Recent Labs  Lab 05/05/18 2031  INR 1.32    Cardiac Enzymes: No results for input(s): CKTOTAL, CKMB, CKMBINDEX, TROPONINI in the last 168 hours.  HbA1C: Hgb A1c MFr Bld  Date/Time Value Ref Range Status  05/06/2018 06:09 AM 4.8 4.8 - 5.6 % Final    Comment:    (NOTE) Pre diabetes:          5.7%-6.4% Diabetes:              >6.4% Glycemic control for   <7.0% adults with diabetes     CBG: Recent Labs  Lab 05/05/18 2008  GLUCAP 98    Admitting History of Present Illness.   77 year old with history of a-fib on eliquis who presents with left sided weakness, neglect and a facial droop.  The patient had a stent placed and needs to be on anticoagulation.  A CT of the chest was done as a response to an abnormal CXR and patient was found to have multiple canonball lesions in the lungs and mediastinum and PCCM was consulted.  Upon interviewing the patient, denies smoking, weight loss or hemoptysis.  Discussed with Dr. Leonie Man.  Review of Systems:   12 point ROS is negative other than above  Past Medical History  She,  has a past medical history of A-fib ([place]) and Pacemaker.   Surgical History    Past Surgical History:  Procedure Laterality Date  . ABDOMINAL HYSTERECTOMY    . RADIOLOGY WITH ANESTHESIA N/A 05/05/2018   Procedure: RADIOLOGY WITH ANESTHESIA;  Surgeon: Luanne Bras, MD;  Location: [facility];  Service: Radiology;  Laterality: N/A;     Social History   Social History   Socioeconomic History  . Marital status: Not on file    Spouse name: Not on file  . Number of  children: Not on file  . Years of education: Not on file  . Highest education level: Not on file  Occupational History  . Not on file  Social Needs  . Financial resource strain: Not on file  . Food insecurity:    Worry: Not on file    Inability: Not on file  .  Transportation needs:    Medical: Not on file    Non-medical: Not on file  Tobacco Use  . Smoking status: Never Smoker  . Smokeless tobacco: Never Used  Substance and Sexual Activity  . Alcohol use: Never    Frequency: Never  . Drug use: Never  . Sexual activity: Not on file  Lifestyle  . Physical activity:    Days per week: Not on file    Minutes per session: Not on file  . Stress: Not on file  Relationships  . Social connections:    Talks on phone: Not on file    Gets together: Not on file    Attends religious service: Not on file    Active member of club or organization: Not on file    Attends meetings of clubs or organizations: Not on file    Relationship status: Not on file  . Intimate partner violence:    Fear of current or ex partner: Not on file    Emotionally abused: Not on file    Physically abused: Not on file    Forced sexual activity: Not on file  Other Topics Concern  . Not on file  Social History Narrative  . Not on file  ,  reports that she has never smoked. She has never used smokeless tobacco. She reports that she does not drink alcohol or use drugs.   Family History   Her family history is not on file.   Allergies No Known Allergies   Home Medications  Prior to Admission medications   Medication Sig Start Date End Date Taking? Authorizing Provider  amLODipine (NORVASC) 5 MG tablet Take 5 mg by mouth daily.   Yes [provider]  apixaban (ELIQUIS) 5 MG TABS tablet Take 5 mg by mouth 2 (two) times daily.   Yes [provider]  diltiazem (CARDIZEM SR) 120 MG 12 hr capsule Take 120 mg by mouth daily.   Yes [provider]  flecainide (TAMBOCOR) 150 MG tablet  Take 150 mg by mouth 2 (two) times daily.   Yes [provider]  fluticasone (FLONASE) 50 MCG/ACT nasal spray Place 1 spray into both nostrils daily.   Yes [provider]  guaiFENesin (MUCINEX) 600 MG 12 hr tablet Take 600 mg by mouth every 12 (twelve) hours. **For 14 days**   Yes [provider]  hydrochlorothiazide (HYDRODIURIL) 25 MG tablet Take 25 mg by mouth daily.   Yes [provider]  metoprolol succinate (TOPROL-XL) 25 MG 24 hr tablet Take 25 mg by mouth daily.   Yes [provider]  omeprazole (PRILOSEC) 20 MG capsule Take 20 mg by mouth daily.   Yes [provider]  potassium chloride SA (K-DUR,KLOR-CON) 20 MEQ tablet Take 20 mEq by mouth 2 (two) times daily.   Yes [provider]  pravastatin (PRAVACHOL) 80 MG tablet Take 80 mg by mouth daily.   Yes [provider]  tiZANidine (ZANAFLEX) 2 MG tablet Take 2 mg by mouth 3 (three) times daily.   Yes [provider]  traMADol (ULTRAM) 50 MG tablet Take 50 mg by mouth 2 (two) times daily as needed for moderate pain.   Yes [provider]    Rush Farmer, M.D. [facility] Pulmonary/Critical Care Medicine. Pager: [phone]. After hours pager: [phone]

## 2018-05-07 NOTE — Progress Notes (Signed)
Pt's is currently in a-fib and heart rate is between  110/130's. Provider Biby notified. Flecainide, metoprolol, diltiazem ordered and administered. Will continue to monitor.

## 2018-05-07 NOTE — Progress Notes (Signed)
Referring Physician(s): CODE STROKE- Aroor, Karena Addison  Supervising Physician: Luanne Bras  Patient Status:  [facility] - In-pt  Chief Complaint: None  Subjective:  Right MCA inferior division M2 segment s/p revascularization using emergent mechanical thrombectomy and rescue stent placement 05/06/2018 by Dr. Estanislado Pandy. Patient awake and alert laying in bed with no complaints at this time. Spontaneously moving all extremities. Right groin incision c/d/i.   Allergies: Patient has no known allergies.  Medications: Prior to Admission medications   Medication Sig Start Date End Date Taking? Authorizing Provider  amLODipine (NORVASC) 5 MG tablet Take 5 mg by mouth daily.   Yes [provider]  apixaban (ELIQUIS) 5 MG TABS tablet Take 5 mg by mouth 2 (two) times daily.   Yes [provider]  diltiazem (CARDIZEM SR) 120 MG 12 hr capsule Take 120 mg by mouth daily.   Yes [provider]  flecainide (TAMBOCOR) 150 MG tablet Take 150 mg by mouth 2 (two) times daily.   Yes [provider]  fluticasone (FLONASE) 50 MCG/ACT nasal spray Place 1 spray into both nostrils daily.   Yes [provider]  guaiFENesin (MUCINEX) 600 MG 12 hr tablet Take 600 mg by mouth every 12 (twelve) hours. **For 14 days**   Yes [provider]  hydrochlorothiazide (HYDRODIURIL) 25 MG tablet Take 25 mg by mouth daily.   Yes [provider]  metoprolol succinate (TOPROL-XL) 25 MG 24 hr tablet Take 25 mg by mouth daily.   Yes [provider]  omeprazole (PRILOSEC) 20 MG capsule Take 20 mg by mouth daily.   Yes [provider]  potassium chloride SA (K-DUR,KLOR-CON) 20 MEQ tablet Take 20 mEq by mouth 2 (two) times daily.   Yes [provider]  pravastatin (PRAVACHOL) 80 MG tablet Take 80 mg by mouth daily.   Yes [provider]  tiZANidine (ZANAFLEX) 2 MG tablet Take 2 mg by mouth 3 (three) times daily.   Yes [provider]  traMADol (ULTRAM) 50 MG tablet Take 50 mg by mouth 2 (two) times daily as needed for moderate pain.   Yes [provider]     Vital Signs: BP (!) 140/58   Pulse 87   Temp 98.6 F (37 C) (Oral)   Resp 20   Ht 5\' 3"  (1.6 m)   SpO2 96%   Physical Exam  Constitutional: She appears well-developed and well-nourished. No distress.  Pulmonary/Chest: Effort normal. No respiratory distress.  Neurological:  Alert, awake, and oriented x3. Speech and comprehension intact. PERRL bilaterally. EOMs intact bilaterally without nystagmus or subjective diplopia. Visual fields not assessed. No facial asymmetry. Tongue midline. Spontaneously moving all extremities. No pronator drift. Fine motor and coordination not assessed. Gait not assessed. Romberg not assessed. Heel to toe not assessed. Distal pulses 2+ bilaterally.  Skin: Skin is warm and dry.  Right groin incision soft without active bleeding or hematoma.  Psychiatric: She has a normal mood and affect. Her behavior is normal. Judgment and thought content normal.  Nursing note and vitals reviewed.   Imaging: Ct Angio Head W Or Wo Contrast  Result Date: 05/05/2018 CLINICAL DATA:  LEFT-sided weakness. EXAM: CT ANGIOGRAPHY HEAD AND NECK TECHNIQUE: Multidetector CT imaging of the head and neck was performed using the standard protocol during bolus administration of intravenous contrast. Multiplanar CT image reconstructions and MIPs were obtained to evaluate the vascular anatomy. Carotid stenosis measurements (when applicable) are obtained utilizing NASCET criteria, using the distal internal carotid diameter as the  denominator. CONTRAST:  26mL ISOVUE-370 IOPAMIDOL (ISOVUE-370) INJECTION 76% COMPARISON:  Code stroke CT earlier today. FINDINGS: CTA NECK FINDINGS Aortic arch: Standard branching. Imaged portion shows no evidence of aneurysm or dissection. No significant stenosis of the major arch vessel origins. Right carotid  system: No evidence of dissection, stenosis (50% or greater) or occlusion. Minor atheromatous change. Left carotid system: No evidence of dissection, stenosis (50% or greater) or occlusion. Minor atheromatous change. Vertebral arteries: Codominant. No evidence of dissection, stenosis (50% or greater) or occlusion. Skeleton: Spondylosis.  Poor dentition. Other neck: No masses.  Airway patent. Upper chest: Widespread BILATERAL pulmonary nodules, subcentimeter, too numerous to count, slightly larger and more numerous to the LEFT. There is mediastinal adenopathy, incompletely evaluated. These nodules could represent diffuse infection or widespread metastatic disease. Small RIGHT effusion. Aortic atherosclerosis is noted. Dual lead pacemaker enters the heart from LEFT subclavian approach. Review of the MIP images confirms the above findings CTA HEAD FINDINGS Anterior circulation: Minor calcific atheromatous change in the BILATERAL carotid siphons. There is a RIGHT MCA M2 posterior division occlusion, 3 mm from the bifurcation. No M1 stenosis on the RIGHT. Both anterior cerebral arteries are patent, LEFT dominant. No LEFT MCA disease of significance. Posterior circulation: No significant stenosis, proximal occlusion, aneurysm, or vascular malformation. Venous sinuses: As permitted by contrast timing, patent. Anatomic variants: RIGHT fetal PCA. Delayed phase: Not performed. Review of the MIP images confirms the above findings IMPRESSION: No extracranial stenosis or dissection. BILATERAL carotid atheromatous change. 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 Innumerable subcentimeter pulmonary nodules, which could represent infection or metastatic disease. CT chest with contrast could provide additional information. These results were called by telephone at the time of interpretation on 05/05/2018 at 8:40 pm to Dr. Samara Snide , who verbally acknowledged these results. Electronically Signed   By: Staci Righter M.D.   On: 05/05/2018 20:51   Ct Head Wo Contrast  Result Date: 05/06/2018 CLINICAL DATA:  Posterior inferior division RIGHT MCA M2 occlusion. S/P RT common carotid arteriogram followed by endovascular revascularization of RT MCA INF division M2 branch with x 3 passes with embotrap 63mm x 33 mm retriever device and X1 pass with solitaire Fr 23mm x 40 mm retriever device achieving a TICI 2 b revascularization ,with rescue stent placement for reocclusion achieving a TICI 3 revascularization. EXAM: CT HEAD WITHOUT CONTRAST TECHNIQUE: Contiguous axial images were obtained from the base of the skull through the vertex without intravenous contrast. COMPARISON:  Multiple prior examinations, 05/05/2018. FINDINGS: Brain: Subarachnoid hemorrhage is observed in the RIGHT sylvian fissure, moderately extensive without visible hemorrhagic infarction or parenchymal hematoma. No subdural collection. Rescue stent is observed in the RIGHT MCA extending across the M1 to M2 segments. Slight hypoattenuation of RIGHT temporal lobe, but normal appearing frontal and parietal lobes. Old LEFT occipital infarct. Hypoattenuation of white matter, likely small vessel disease. Vascular: No visible large vessel occlusion or reocclusion. Skull: Intact Sinuses/Orbits: No acute findings Other: None IMPRESSION: RIGHT sylvian fissure subarachnoid hemorrhage status post endovascular treatment for RIGHT MCA M2 occlusion. This corresponds to [place] bleeding classification 3C. No evidence for hemorrhagic infarction or parenchymal hematoma. Developing hypoattenuation, RIGHT temporal lobe suspected early cytotoxic edema. Electronically Signed   By: Staci Righter M.D.   On: 05/06/2018 13:37   Ct Angio Neck W Or Wo Contrast  Result Date: 05/05/2018 CLINICAL DATA:  LEFT-sided weakness. EXAM: CT ANGIOGRAPHY HEAD AND NECK TECHNIQUE: Multidetector CT imaging of the head and neck was performed using  the standard protocol during bolus  administration of intravenous contrast. Multiplanar CT image reconstructions and MIPs were obtained to evaluate the vascular anatomy. Carotid stenosis measurements (when applicable) are obtained utilizing NASCET criteria, using the distal internal carotid diameter as the denominator. CONTRAST:  23mL ISOVUE-370 IOPAMIDOL (ISOVUE-370) INJECTION 76% COMPARISON:  Code stroke CT earlier today. FINDINGS: CTA NECK FINDINGS Aortic arch: Standard branching. Imaged portion shows no evidence of aneurysm or dissection. No significant stenosis of the major arch vessel origins. Right carotid system: No evidence of dissection, stenosis (50% or greater) or occlusion. Minor atheromatous change. Left carotid system: No evidence of dissection, stenosis (50% or greater) or occlusion. Minor atheromatous change. Vertebral arteries: Codominant. No evidence of dissection, stenosis (50% or greater) or occlusion. Skeleton: Spondylosis.  Poor dentition. Other neck: No masses.  Airway patent. Upper chest: Widespread BILATERAL pulmonary nodules, subcentimeter, too numerous to count, slightly larger and more numerous to the LEFT. There is mediastinal adenopathy, incompletely evaluated. These nodules could represent diffuse infection or widespread metastatic disease. Small RIGHT effusion. Aortic atherosclerosis is noted. Dual lead pacemaker enters the heart from LEFT subclavian approach. Review of the MIP images confirms the above findings CTA HEAD FINDINGS Anterior circulation: Minor calcific atheromatous change in the BILATERAL carotid siphons. There is a RIGHT MCA M2 posterior division occlusion, 3 mm from the bifurcation. No M1 stenosis on the RIGHT. Both anterior cerebral arteries are patent, LEFT dominant. No LEFT MCA disease of significance. Posterior circulation: No significant stenosis, proximal occlusion, aneurysm, or vascular malformation. Venous sinuses: As permitted by contrast timing, patent. Anatomic variants: RIGHT fetal PCA.  Delayed phase: Not performed. Review of the MIP images confirms the above findings IMPRESSION: No extracranial stenosis or dissection. BILATERAL carotid atheromatous change. 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 Innumerable subcentimeter pulmonary nodules, which could represent infection or metastatic disease. CT chest with contrast could provide additional information. These results were called by telephone at the time of interpretation on 05/05/2018 at 8:40 pm to Dr. Samara Snide , who verbally acknowledged these results. Electronically Signed   By: Staci Righter M.D.   On: 05/05/2018 20:51   Ct Chest W Contrast  Result Date: 05/06/2018 CLINICAL DATA:  Recent left-sided weakness. CTA of the neck demonstrating pulmonary nodules. No current chest complaints. EXAM: CT CHEST WITH CONTRAST TECHNIQUE: Multidetector CT imaging of the chest was performed during intravenous contrast administration. CONTRAST:  45mL OMNIPAQUE IOHEXOL 300 MG/ML  SOLN COMPARISON:  Neck CTA including 05/05/2018. Chest radiograph of 05/06/2018 FINDINGS: Cardiovascular: Aortic and branch vessel atherosclerosis. Dual lead pacer. Mild cardiomegaly, without pericardial effusion. Pulmonary artery enlargement, outflow tract 3.6 cm No central pulmonary embolism, on this non-dedicated study. Mediastinum/Nodes: No axillary adenopathy. Right paratracheal node measures 1.4 cm on image 39/3. Right hilar adenopathy at 2.2 cm on image 56/3. Left infrahilar node of 1.4 cm on image 64/3. Small hiatal hernia. Lungs/Pleura: Trace right pleural fluid. Innumerable pulmonary nodules throughout. Index right lower lobe pulmonary nodule measures 1.7 x 1.6 cm on image 85/7. Index left lower lobe nodule measures 1.9 x 1.6 cm on image 91/7. A pleural-based right lower lobe lung mass measures 3.1 x 2.4 cm on image 79/7. Upper Abdomen: Small volume perihepatic and perisplenic ascites. Normal imaged portions of the liver, spleen, kidneys. There is  edema in the anterior pararenal space including on image 118/3. Soft tissue fullness posterior to the pancreas and surrounding the celiac including on image 126/3. This is incompletely imaged. Mild right greater than left adrenal nodularity,  nonspecific. Abdominal aortic and branch vessel atherosclerosis. Adjacent gastrohepatic ligament adenopathy including at 1.7 cm on image 117/3. Musculoskeletal: Mild thoracic spondylosis. IMPRESSION: 1. 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 2. Abnormal appearance of the abdominal retroperitoneum with soft tissue surrounding the proximal celiac. Although this appearance typically is seen with pancreatic carcinoma, given absence of symptoms or abdominal complaints, lymphoma or upper abdominal metastasis from an unknown primary are considerations. 3. Consider PET and/or dedicated abdominopelvic contrast-enhanced CT. 4. Small hiatal hernia. 5. Small right pleural effusion. 6. Coronary artery atherosclerosis. Aortic Atherosclerosis (ICD10-I70.0). 7. Pulmonary artery enlargement suggests pulmonary arterial hypertension. 8. Small volume abdominal ascites. 9. Peripancreatic edema for which superimposed pancreatitis cannot be excluded. Electronically Signed   By: Abigail Miyamoto M.D.   On: 05/06/2018 15:07   Dg Chest Port 1 View  Result Date: 05/06/2018 CLINICAL DATA:  Recent stroke EXAM: PORTABLE CHEST 1 VIEW COMPARISON:  None. FINDINGS: Cardiac shadow is at the upper limits of normal in size. Pacing device is noted. Diffuse vascular congestion and patchy densities are noted bilaterally likely related to pulmonary edema. No focal confluent infiltrate is seen. No sizable effusion is noted. IMPRESSION: Diffuse pulmonary edema. Electronically Signed   By: Inez Catalina M.D.   On: 05/06/2018 10:31   Ct Head Code Stroke Wo Contrast  Result Date: 05/05/2018 CLINICAL DATA:  Code stroke.  LEFT-sided deficits. EXAM: CT HEAD WITHOUT CONTRAST TECHNIQUE:  Contiguous axial images were obtained from the base of the skull through the vertex without intravenous contrast. COMPARISON:  None. FINDINGS: Brain: No evidence for acute infarction, hemorrhage, mass lesion, hydrocephalus, or extra-axial fluid. Generalized atrophy. Hypoattenuation of white matter, likely small vessel disease. Vascular: Calcification of the cavernous internal carotid arteries consistent with cerebrovascular atherosclerotic disease. No signs of intracranial large vessel occlusion. Skull: Calvarium intact.  No worrisome osseous lesion. Sinuses/Orbits: Dense lenticular opacities. No significant sinus fluid. Other: None. ASPECTS [facility] Stroke Program Early CT Score) - Ganglionic level infarction (caudate, lentiform nuclei, internal capsule, insula, M1-M3 cortex): 7 - Supraganglionic infarction (M4-M6 cortex): 3 Total score (0-10 with 10 being normal): 10 IMPRESSION: 1. Atrophy and small vessel disease. No acute intracranial findings. No signs of large vessel occlusion. 2. ASPECTS is 10. These results were communicated to Dr. Lorraine Lax at 8:25 pmon 10/12/2019by text page via the [facility] messaging system. Electronically Signed   By: Staci Righter M.D.   On: 05/05/2018 20:26    Labs:  CBC: Recent Labs    05/05/18 2031 05/06/18 0609 05/07/18 0457 05/07/18 0650  WBC 8.9 10.6* 8.5  --   HGB 9.8* 8.0* 6.1* 5.8*  HCT 30.1* 24.3* 19.1* 18.5*  PLT 196 171 143*  --     COAGS: Recent Labs    05/05/18 2031  INR 1.32  APTT 37*    BMP: Recent Labs    05/05/18 2009 05/05/18 2031 05/06/18 0609 05/07/18 0457  NA 140 137 139 141  K 3.3* 3.1* 3.3* 3.2*  CL 106 105 106 112*  CO2  --  23 21* 22  GLUCOSE 109* 107* 124* 100*  BUN 16 14 11 11   CALCIUM  --  9.6 9.2 8.9  CREATININE 0.70 0.83 0.68 0.67  GFRNONAA  --  >60 >60 >60  GFRAA  --  >60 >60 >60    LIVER FUNCTION TESTS: Recent Labs    05/05/18 2031  BILITOT 0.5  AST 26  ALT 17  ALKPHOS 153*  PROT 7.2  ALBUMIN 3.5     Assessment  and Plan:  Right MCA inferior division M2 segment s/p revascularization using emergent mechanical thrombectomy and rescue stent placement 05/06/2018 by Dr. Estanislado Pandy. Patient's condition improving- spontaneously moving all extremities. Right groin incision stable. Continue taking Brilinta 90 mg twice daily and Aspirin 81 mg once daily. Plan to follow-up with Dr. Estanislado Pandy in clinic 4 weeks after discharge. Appreciate and agree with neurology management. Please call IR with questions/concerns.   Electronically Signed: Earley Abide, PA-C 05/07/2018, 3:41 PM   I spent a total of 15 Minutes at the the patient's bedside AND on the patient's hospital floor or unit, greater than 50% of which was counseling/coordinating care for right MCA inferior division M2 segment s/p revascularization.

## 2018-05-07 NOTE — Progress Notes (Signed)
Report called to Ala RN on 3w. Patient going to room 24.

## 2018-05-07 NOTE — Progress Notes (Signed)
Occupational Therapy Evaluation Patient Details Name: Deanna Guerrero MRN: [id number] DOB: March 08, 1941 Today's Date: 05/07/2018    History of Present Illness Deanna Guerrero is an 77 y.o. female with past medical history of atrial fibrillation on Eliquis, recent pacemaker placement presents to the emergency room as a code stroke for left-sided weakness and neglect and facial droop. CT angiogram revealed R MT-M2 occulsion and IR was activated.   Clinical Impression   PTA, pt lived alone and was very independent with ADL and mobility. At this time recommend DC home with [facility] and initial 24/7 S. Will further assess vision and cognition. Inconsistent responses with L upper visual quadrant. Session limited by R groin pain. Daughter present for session. Will follow acutely to facilitate safe DC home.     Follow Up Recommendations  Home health OT;Supervision/Assistance - 24 hour(initially)    Equipment Recommendations  3 in 1 bedside commode    Recommendations for Other Services       Precautions / Restrictions Precautions Precautions: Fall Precaution Comments: R groin pain from stent Restrictions Weight Bearing Restrictions: No      Mobility Bed Mobility Overal bed mobility: Needs Assistance Bed Mobility: Rolling;Sidelying to Sit Rolling: Min assist Sidelying to sit: Min assist       General bed mobility comments: encouraged pt to roll to side to decrease pain with bed mobility  Transfers Overall transfer level: Needs assistance Equipment used: 1 person hand held assist Transfers: Sit to/from Stand Sit to Stand: Min assist         General transfer comment: min a to power up, pt with dec R LE WBing due to soreness in groin    Balance Overall balance assessment: Mild deficits observed, not formally tested(benefits from RW during amb due to R LE pain)                                         ADL either performed or assessed with clinical judgement   ADL  Overall ADL's : Needs assistance/impaired     Grooming: Set up;Sitting   Upper Body Bathing: Set up;Sitting   Lower Body Bathing: Minimal assistance;Sit to/from stand   Upper Body Dressing : Supervision/safety;Sitting   Lower Body Dressing: Minimal assistance;Sit to/from stand   Toilet Transfer: Minimal assistance;Stand-pivot;BSC   Toileting- Clothing Manipulation and Hygiene: Minimal assistance;Sit to/from stand       Functional mobility during ADLs: Minimal assistance(mild unsteadiness)       Vision Baseline Vision/History: Wears glasses Wears Glasses: Reading only Additional Comments: will further assess; no difficulty with menu scanning; reading Inconsistent responses to L upper field  - will further assess     Perception Perception Comments: difficulty with double simultaneous stimulation; will further assess   Praxis      Pertinent Vitals/Pain Pain Assessment: Faces Faces Pain Scale: Hurts even more Pain Location: R groin at sheeth Pain Descriptors / Indicators: Sore Pain Intervention(s): Limited activity within patient's tolerance     Hand Dominance Right   Extremity/Trunk Assessment Upper Extremity Assessment Upper Extremity Assessment: Overall WFL for tasks assessed   Lower Extremity Assessment Lower Extremity Assessment: Defer to PT evaluation   Cervical / Trunk Assessment Cervical / Trunk Assessment: Normal   Communication Communication Communication: No difficulties   Cognition Arousal/Alertness: Awake/alert Behavior During Therapy: WFL for tasks assessed/performed  General Comments: Will further assess; increased time for problem solving; difficulty with R/L discrimination tasks   General Comments       Exercises Exercises: Other exercises Other Exercises Other Exercises: L hand edematous likely from IV; encouraged elevation and ice adn frequent composite flexion/extension   Shoulder  Instructions      Home Living Family/patient expects to be discharged to:: Private residence Living Arrangements: Alone Available Help at Discharge: Family;Available 24 hours/day Type of Home: House Home Access: Ramped entrance     Home Layout: One level     Bathroom Shower/Tub: Teacher, early years/pre: Standard Bathroom Accessibility: Yes How Accessible: Accessible via walker Home Equipment: Grab bars - tub/shower;Shower seat      Lives With: Alone    Prior Functioning/Environment Level of Independence: Independent        Comments: drove, did grocery shopping, no difficulty; volunteers in [employer]        OT Problem List: Decreased activity tolerance;Impaired balance (sitting and/or standing);Decreased safety awareness;Decreased knowledge of use of DME or AE;Cardiopulmonary status limiting activity;Obesity;Pain;Increased edema      OT Treatment/Interventions: Self-care/ADL training;Neuromuscular education;DME and/or AE instruction;Energy conservation;Therapeutic activities;Visual/perceptual remediation/compensation;Patient/family education    OT Goals(Current goals can be found in the care plan section) Acute Rehab OT Goals Patient Stated Goal: go home OT Goal Formulation: With patient Time For Goal Achievement: 05/21/18 Potential to Achieve Goals: Good  OT Frequency: Min 2X/week   Barriers to D/C:            Co-evaluation              AM-PAC PT "6 Clicks" Daily Activity     Outcome Measure Help from another person eating meals?: None Help from another person taking care of personal grooming?: None Help from another person toileting, which includes using toliet, bedpan, or urinal?: A Little Help from another person bathing (including washing, rinsing, drying)?: A Little Help from another person to put on and taking off regular upper body clothing?: None Help from another person to put on and taking off regular lower body  clothing?: A Little 6 Click Score: 21   End of Session Nurse Communication: Mobility status  Activity Tolerance: Patient tolerated treatment well Patient left: in bed;with call bell/phone within reach;with family/visitor present  OT Visit Diagnosis: Unsteadiness on feet (R26.81);Pain Pain - part of body: (R groin)                Time: 0569-7948 OT Time Calculation (min): 25 min Charges:  OT General Charges $OT Visit: 1 Visit OT Evaluation $OT Eval Moderate Complexity: 1 Mod OT Treatments $Self Care/Home Management : 8-22 mins  Maurie Boettcher, OT/L   Acute OT Clinical Specialist [place] Pager [phone] Office [phone]   [facility] 05/07/2018, 5:09 PM

## 2018-05-07 NOTE — Progress Notes (Addendum)
STROKE TEAM PROGRESS NOTE   SUBJECTIVE (INTERVAL HISTORY) No family at bedside. Patient up in chair. Asking about test results. Dr. Leonie Man shared with her abnormal chest results and asked to speak with her and her daughter together this afternoon. Pt agreeable and understandably worried.   OBJECTIVE Vitals:   05/07/18 0600 05/07/18 0630 05/07/18 0631 05/07/18 0700  BP: (!) 121/47   134/60  Pulse: 82 85 85 86  Resp:      Temp:      TempSrc:      SpO2: 92% 96% 97% 100%  Height:        CBC:  Recent Labs  Lab 05/05/18 2031 05/06/18 0609 05/07/18 0457 05/07/18 0650  WBC 8.9 10.6* 8.5  --   NEUTROABS 6.9 9.5*  --   --   HGB 9.8* 8.0* 6.1* 5.8*  HCT 30.1* 24.3* 19.1* 18.5*  MCV 80.9 80.5 82.0  --   PLT 196 171 143*  --     Basic Metabolic Panel:  Recent Labs  Lab 05/06/18 0609 05/07/18 0457  NA 139 141  K 3.3* 3.2*  CL 106 112*  CO2 21* 22  GLUCOSE 124* 100*  BUN 11 11  CREATININE 0.68 0.67  CALCIUM 9.2 8.9    IMAGING Ct Head Code Stroke Wo Contrast 05/05/2018 1. Atrophy and small vessel disease. No acute intracranial findings. No signs of large vessel occlusion.  2. ASPECTS is 10.   Ct Angio Head W Or Wo Contrast Ct Angio Neck W Or Wo Contrast 05/05/2018 No extracranial stenosis or dissection. BILATERAL carotid atheromatous change. 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 Innumerable subcentimeter pulmonary nodules, which could represent infection or metastatic disease. CT chest with contrast could provide additional information.   Cerebral Angiogram 05/06/2018 S/P RT common carotid arteriogram followed by endovascular revascularization of RT MCA INF division M2 branch with x 3 passes with embotrap 21mm x 33 mm retriever device and X1 pass with  solitaire Fr 67mm x 40 mm retriever device achieving a TICI 2 b revascularization, with rescue stent placement for reocclusion achieving a TICI 3 revascularization.  Ct Head Wo Contrast 05/06/2018 RIGHT  sylvian fissure subarachnoid hemorrhage status post endovascular treatment for RIGHT MCA M2 occlusion. This corresponds to [place] bleeding classification 3C. No evidence for hemorrhagic infarction or parenchymal hematoma. Developing hypoattenuation, RIGHT temporal lobe suspected early cytotoxic edema.   Ct Chest W Contrast 05/06/2018 1. 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 2. Abnormal appearance of the abdominal retroperitoneum with soft tissue surrounding the proximal celiac. Although this appearance typically is seen with pancreatic carcinoma, given absence of symptoms or abdominal complaints, lymphoma or upper abdominal metastasis from an unknown primary are considerations. 3. Consider PET and/or dedicated abdominopelvic contrast-enhanced CT. 4. Small hiatal hernia. 5. Small right pleural effusion. 6. Coronary artery atherosclerosis. Aortic Atherosclerosis (ICD10-I70.0). 7. Pulmonary artery enlargement suggests pulmonary arterial hypertension. 8. Small volume abdominal ascites. 9. Peripancreatic edema for which superimposed pancreatitis cannot be excluded.    Transthoracic Echocardiogram  - Left ventricle: The cavity size was normal. Wall thickness was increased in a pattern of mild LVH. Systolic function was vigorous. The estimated ejection fraction was in the range of 65% to 70%. Wall motion was normal; there were no regional wall motion abnormalities. Doppler parameters are consistent with abnormal left ventricular relaxation (grade 1 diastolic dysfunction). The Ee&' ratio is between 8-15 suggesting indeterminate LV filling pressure. - Aortic valve: Sclerosis without stenosis. There was mild regurgitation. -  Left atrium: The atrium was normal in size. - Right ventricle: The cavity size was normal. Wall thickness was normal. Pacer wire or catheter noted in right ventricle. Systolic function was normal. - Right atrium: The atrium was normal in size. Pacer wire or  catheter noted in right atrium. - Tricuspid valve: There was trivial regurgitation. - Pulmonary arteries: PA peak pressure: 25 mm Hg (S). - Inferior vena cava: The vessel was normal in size. The respirophasic diameter changes were in the normal range (>= 50%), consistent with normal central venous pressure. Impressions:   LVEF 65-70%, mild LVH, normal wall motion, grade 1 DD, indeterminate LV filling pressure, aortic sclerosis with mild AI, trivial TR, RVSP 25 mmHg, normal IVC.   PHYSICAL EXAM elderly african [place] lady who is awake and alert. . Afebrile. Head is nontraumatic. Neck is supple without bruit. Cardiac exam no murmur or gallop. Lungs are clear to auscultation. Distal pulses are well felt. Neurological Exam :She is alert and oriented to self, place, month, year, appears to be a good historian,  Following commands, moves all extremities to command without drift, sensation intact, EOMI, PERRL, blinks to threat bilat, face symmetric, tongue midline,spontaneous movements x4, toes equiv, no clonus.  Gait: deferred ASSESSMENT/PLAN Ms. Cyndi Montejano is a 77 y.o. female with history of atrial fibrillation on Eliquis, recent pacemaker placement  presenting with left-sided weakness, neglect and facial droop. She did not receive IV t-PA due to anticoagulation. S/P RT MCA INF division M2 branch mechanical thrombectomy and rescue stent  Stroke:  R temporal lobe infarct s/p IR w/ TICI3 revascularization and R MCA stent placement, infarct embolic d/t known AF while on anticoagulation  CT head - Atrophy and small vessel disease. ASPECTS 10  CTA H & N R M2 occlusion 28mm from bifurcation. innumerable subcentimeter pulm nodules infection vs mets.   Angio endovascular revascularization of RT MCA INF division M2 branch with x 3 passes with embotrap 12mm x 33 mm retriever device and X1 pass with  solitaire Fr 52mm x 40 mm retriever device achieving a TICI 2 b revascularization, with rescue stent  placement for reocclusion achieving a TICI 3 revascularization.  MRI/MRA head - recent ppm placement  2D Echo - EF 65-70%. No source of embolus. Aortic sclerosis.  Repeat CT head R temporal lobe edema, R sylvian fissure SAH sp IR  LDL - 103  HgbA1c - 4.8  VTE prophylaxis - SCDs  Eliquis (apixaban) daily prior to admission, now on aspirin 81 mg daily and Brilinta 90 mg BID  Therapy recommendations:  HH PT  Disposition:  Pending  Atrial Fibrillation w/ RVR . Home anticoagulation:  Eliquis (apixaban) daily  . No eliquis at this time d/t SAH post IR . Resume home Cardizem, Toprol and flecainide   Hypertension  Stable  Treated w/ Cleviprex in ICU post IR, now off . Home meds:  norvasc 5, cardizem 120, HCTZ 25, toprol 25 . Now 120-150s . Follow up BP in am, slowly resume home meds . 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   Chest CT with contrast - Widespread metastatic disease to the lungs and thoracic nodal stations. Abnormal appearance of the abdominal retroperitoneum with soft tissue surrounding the proximal celiac typically is seen with pancreatic carcinoma, Small volume abdominal ascites. Peripancreatic edema for which superimposed pancreatitis cannot be excluded.    Dr. Leonie Man discussed findings w/ pt and dtr  Pulmonary consulted  Unable to perform bronch, bx given inability to stop antiplatelets at this time  Needs OP PET scan- dx multiple pulm  nodules  Other Active Problems  Innumerable subcentimeter pulmonary nodules could represent infection or metastatic disease. CT chest with contrast tomorrow when off bedrest.  Hypokalemia - 3.3 - supplement  Anemia - Hb 9.8 -> 8.0 - monitor for retroperitoneal hematoma  Mild leukocytosis - 10.6   Hospital day # 2  Burnetta Sabin, MSN, APRN, ANVP-BC, AGPCNP-BC Advanced Practice Stroke Nurse [place] for Schedule & Pager information 05/07/2018 3:57 PM  I have personally examined this patient, reviewed notes, independently viewed imaging studies, participated in medical decision making and plan of care.ROS completed by me personally and pertinent positives fully documented  I have made any additions or clarifications directly to the above note. Agree with note above. The patient is doing quite well from stroke standpoint and has made excellent neurological recovery but unfortunately chest x-ray and CT scan of the chest are abnormal showing multiple pulmonary and pleural nodules possibly metastasis from GI primary. Patient will need tissue biopsy prior to considering chemotherapy or radiation. She is a needs to be on antiplatelet therapy for her right MCA stent but biopsy cannot be done while she is on Brilinta. Have discussed the challenging situation with Dr. Estanislado Pandy as well as with pulmonologist Dr. Nelda Marseille. I think the best course for him would be to stop Brilinta and switch the patient IV heparin which can BE held a few hours prior to planned biopsy. Will consult interventional radiology to see if they could do this in the next couple of days. Long discussion with the patient and her daughter at the bedside and discuss plan of care and answered questions.This patient is critically ill and at significant risk of neurological worsening, death and care requires constant monitoring of vital signs, hemodynamics,respiratory and cardiac monitoring, extensive review of multiple  databases, frequent neurological assessment, discussion with family, other specialists and medical decision making of high complexity.I have made any additions or clarifications directly to the above note.This critical care time does not reflect procedure time, or teaching time or supervisory time of PA/NP/Med Resident etc but could involve care discussion time.  I spent 35 minutes of neurocritical care time  in the care of  this patient.     Antony Contras, MD Medical Director [facility] Pager: [phone] 05/07/2018 4:17 PM  To contact Stroke Continuity provider, please refer to [email or web address] After hours, contact General Neurology

## 2018-05-07 NOTE — Telephone Encounter (Signed)
Need to order a PET scan on this patient once she is an outpt (currently admitted for CVA); dx is multiple pulmonary nodules.   She will need hosp f/u visit w me to go over the scan  Sending this to you so we do not forget to arrange

## 2018-05-07 NOTE — Progress Notes (Addendum)
ANTICOAGULATION CONSULT NOTE - Initial Consult  Pharmacy Consult for heparin Indication: stroke  No Known Allergies  Patient Measurements: Height: 5\' 2"  (157.5 cm) Weight: 181 lb (82.1 kg) IBW/kg (Calculated) : 50.1 Heparin Dosing Weight: 68.5 kg  Vital Signs: Temp: 98.8 F (37.1 C) (10/14 1628) Temp Source: Oral (10/14 1628) BP: 158/82 (10/14 1628) Pulse Rate: 115 (10/14 1628)  Labs: Recent Labs    05/05/18 2031 05/06/18 0609 05/07/18 0457 05/07/18 0650  HGB 9.8* 8.0* 6.1* 5.8*  HCT 30.1* 24.3* 19.1* 18.5*  PLT 196 171 143*  --   APTT 37*  --   --   --   LABPROT 16.3*  --   --   --   INR 1.32  --   --   --   CREATININE 0.83 0.68 0.67  --     Estimated Creatinine Clearance: 58.5 mL/min (by C-G formula based on SCr of 0.67 mg/dL).   Medical History: Past Medical History:  Diagnosis Date  . A-fib ([place])   . Pacemaker     Medications:  Medications Prior to Admission  Medication Sig Dispense Refill Last Dose  . amLODipine (NORVASC) 5 MG tablet Take 5 mg by mouth daily.   05/05/2018 at Unknown time  . apixaban (ELIQUIS) 5 MG TABS tablet Take 5 mg by mouth 2 (two) times daily.   05/05/2018 at pm  . diltiazem (CARDIZEM SR) 120 MG 12 hr capsule Take 120 mg by mouth daily.   05/05/2018 at Unknown time  . flecainide (TAMBOCOR) 150 MG tablet Take 150 mg by mouth 2 (two) times daily.   05/05/2018 at Unknown time  . fluticasone (FLONASE) 50 MCG/ACT nasal spray Place 1 spray into both nostrils daily.   unknown at prn  . guaiFENesin (MUCINEX) 600 MG 12 hr tablet Take 600 mg by mouth every 12 (twelve) hours. **For 14 days**   05/05/2018 at Unknown time  . hydrochlorothiazide (HYDRODIURIL) 25 MG tablet Take 25 mg by mouth daily.   05/05/2018 at Unknown time  . metoprolol succinate (TOPROL-XL) 25 MG 24 hr tablet Take 25 mg by mouth daily.   05/05/2018 at am  . omeprazole (PRILOSEC) 20 MG capsule Take 20 mg by mouth daily.   05/05/2018 at Unknown time  . potassium chloride SA  (K-DUR,KLOR-CON) 20 MEQ tablet Take 20 mEq by mouth 2 (two) times daily.   05/05/2018 at Unknown time  . pravastatin (PRAVACHOL) 80 MG tablet Take 80 mg by mouth daily.   05/05/2018 at Unknown time  . tiZANidine (ZANAFLEX) 2 MG tablet Take 2 mg by mouth 3 (three) times daily.   05/05/2018 at Unknown time  . traMADol (ULTRAM) 50 MG tablet Take 50 mg by mouth 2 (two) times daily as needed for moderate pain.   unknown    Assessment: 77 y/o female on apixaban PTA for Afib admitted as a code stroke. Last apixaban dose was 10/12 pm. She is s/p right MCA inferior division M2 segment s/p revascularization using mechanical thrombectomy and rescue stent placement on 10/13. Pulmonary nodules found and are suspicious for metastatic disease vs infection. Pharmacy consulted to begin IV heparin while anticoagulant and antiplatelet therapy is on hold for tissue biopsy. ABLA noted, Hgb 5.8 and receiving 2 units PRBC today. Apixaban may still affect the ability to use heparin levels for monitoring so will check baseline heparin level and aPTT.  Goal of Therapy:  Heparin level 0.3-0.5 units/ml aPTT 66-85 seconds Monitor platelets by anticoagulation protocol: Yes   Plan:  - Begin  heparin drip at 800 units/hr with no bolus - STAT baseline aPTT and heparin level - 8 hr heparin level and aPTT - Daily heparin level, aPTT and CBC - Monitor for s/sx of bleeding   Renold Genta, PharmD, BCPS Clinical Pharmacist Clinical phone for 05/07/2018 until 10p is [phone] Please check AMION for all Pharmacist numbers by unit 05/07/2018 5:12 PM    Addendum: Per phlebotomy, unable to get baseline labs because blood is infusing.  Renold Genta, PharmD, BCPS Clinical Pharmacist 05/07/2018 7:42 PM

## 2018-05-07 NOTE — Evaluation (Signed)
Physical Therapy Evaluation Patient Details Name: Deanna Guerrero MRN: [id number] DOB: July 14, 1941 Today's Date: 05/07/2018   History of Present Illness  Deanna Guerrero is an 77 y.o. female with past medical history of atrial fibrillation on Eliquis, recent pacemaker placement presents to the emergency room as a code stroke for left-sided weakness and neglect and facial droop. CT angiogram revealed R MT-M2 occulsion and IR was activated.  Clinical Impression  Pt admitted with above. Pt was indep and driving PTA. Pt now with R LE soreness from sheath and noted minimal L sided weakness. Anticipate pt to progress quickly and will be able to d/c home with 24/7 assist initially and home health PT. Acute PT to cont to follow.    Follow Up Recommendations Home health PT;Supervision/Assistance - 24 hour(initially)    Equipment Recommendations  Rolling walker with 5" wheels    Recommendations for Other Services       Precautions / Restrictions Precautions Precautions: Fall Precaution Comments: R groin pain from stent Restrictions Weight Bearing Restrictions: No      Mobility  Bed Mobility Overal bed mobility: Needs Assistance Bed Mobility: Rolling;Sidelying to Sit Rolling: Min assist Sidelying to sit: Min assist       General bed mobility comments: pt reaching for help however could complete task with max verbal cues to use L UE to push up into sitting from sidelying  Transfers Overall transfer level: Needs assistance Equipment used: 1 person hand held assist Transfers: Sit to/from Stand Sit to Stand: Min assist         General transfer comment: min a to power up, pt with dec R LE WBing due to soreness in groin  Ambulation/Gait Ambulation/Gait assistance: Min assist Gait Distance (Feet): 10 Feet(x2 (to/from bathroom)) Assistive device: 1 person hand held assist;Rolling walker (2 wheeled) Gait Pattern/deviations: Step-through pattern;Decreased stride length;Decreased stance  time - right;Antalgic Gait velocity: decreased as expected Gait velocity interpretation: <1.31 ft/sec, indicative of household ambulator General Gait Details: pt amb to bathroom without rolling walker holding onto PTs hand, pt with limited Wbing on R LE, short shuffled steps. Pt given RW to use on the way back, pt with improved step length and sequencing of steps, ambulation limited by dizziness and lightheadedness. Pt to get 2 units of blood this AM.  Stairs            Wheelchair Mobility    Modified Rankin (Stroke Patients Only) Modified Rankin (Stroke Patients Only) Pre-Morbid Rankin Score: No symptoms Modified Rankin: Moderate disability     Balance Overall balance assessment: Mild deficits observed, not formally tested(benefits from RW during amb due to R LE pain)                                           Pertinent Vitals/Pain Pain Assessment: Faces Faces Pain Scale: Hurts a little bit Pain Location: R groin at [place] Pain Descriptors / Indicators: Sore Pain Intervention(s): Monitored during session    Home Living Family/patient expects to be discharged to:: Private residence Living Arrangements: Alone Available Help at Discharge: Family;Available 24 hours/day Type of Home: House Home Access: Ramped entrance     Home Layout: One level Home Equipment: Grab bars - tub/shower;Shower seat      Prior Function Level of Independence: Independent         Comments: drove, did grocery shopping, no difficulty     Hand Dominance  Dominant Hand: Right    Extremity/Trunk Assessment   Upper Extremity Assessment Upper Extremity Assessment: LUE deficits/detail LUE Deficits / Details: MMT grossly 4/5, v/c's to use during function    Lower Extremity Assessment Lower Extremity Assessment: LLE deficits/detail LLE Deficits / Details: grossly 4+/5    Cervical / Trunk Assessment Cervical / Trunk Assessment: Normal  Communication    Communication: No difficulties  Cognition Arousal/Alertness: Awake/alert Behavior During Therapy: WFL for tasks assessed/performed Overall Cognitive Status: Within Functional Limits for tasks assessed                                 General Comments: pt with mild L sided neglect functionally requiring v/c's to use L UE to get up      General Comments General comments (skin integrity, edema, etc.): pt assisted to the bathroom, pt able to perform hygiene with R UE in sitting    Exercises     Assessment/Plan    PT Assessment Patient needs continued PT services  PT Problem List Decreased strength;Decreased activity tolerance;Decreased balance;Decreased mobility;Decreased coordination;Decreased cognition;Decreased knowledge of use of DME;Decreased safety awareness       PT Treatment Interventions DME instruction;Gait training;Stair training;Functional mobility training;Therapeutic activities;Therapeutic exercise;Balance training;Neuromuscular re-education;Cognitive remediation    PT Goals (Current goals can be found in the Care Plan section)  Acute Rehab PT Goals Patient Stated Goal: go home PT Goal Formulation: With patient Time For Goal Achievement: 05/21/18 Potential to Achieve Goals: Good    Frequency Min 4X/week   Barriers to discharge        Co-evaluation               AM-PAC PT "6 Clicks" Daily Activity  Outcome Measure Difficulty turning over in bed (including adjusting bedclothes, sheets and blankets)?: Unable Difficulty moving from lying on back to sitting on the side of the bed? : Unable Difficulty sitting down on and standing up from a chair with arms (e.g., wheelchair, bedside commode, etc,.)?: Unable Help needed moving to and from a bed to chair (including a wheelchair)?: A Little Help needed walking in hospital room?: A Little Help needed climbing 3-5 steps with a railing? : A Little 6 Click Score: 12    End of Session Equipment Utilized  During Treatment: Gait belt Activity Tolerance: Patient tolerated treatment well Patient left: in chair;with call bell/phone within reach;with nursing/sitter in room Nurse Communication: Mobility status PT Visit Diagnosis: Unsteadiness on feet (R26.81)    Time: 9191-6606 PT Time Calculation (min) (ACUTE ONLY): 26 min   Charges:   PT Evaluation $PT Eval Moderate Complexity: 1 Mod PT Treatments $Gait Training: 8-22 mins        Deanna Guerrero, PT, DPT Acute Rehabilitation Services Pager #: [phone] Office #: [phone]   Berline Lopes 05/07/2018, 10:38 AM

## 2018-05-07 NOTE — Progress Notes (Signed)
1st attempt to call report. Multiple attempts to reach unit and no one will answer the phone. Will call back again in 15 minutes.

## 2018-05-08 ENCOUNTER — Inpatient Hospital Stay (HOSPITAL_COMMUNITY): Payer: Medicare Other

## 2018-05-08 ENCOUNTER — Encounter (HOSPITAL_COMMUNITY): Payer: Self-pay | Admitting: Interventional Radiology

## 2018-05-08 LAB — TYPE AND SCREEN
ABO/RH(D): O POS
Antibody Screen: NEGATIVE
UNIT DIVISION: 0
Unit division: 0

## 2018-05-08 LAB — BPAM RBC
BLOOD PRODUCT EXPIRATION DATE: 201911052359
Blood Product Expiration Date: 201911052359
ISSUE DATE / TIME: 201910141557
ISSUE DATE / TIME: 201910141252
UNIT TYPE AND RH: 5100
Unit Type and Rh: 5100

## 2018-05-08 LAB — BASIC METABOLIC PANEL
Anion gap: 10 (ref 5–15)
BUN: 8 mg/dL (ref 8–23)
CO2: 20 mmol/L — ABNORMAL LOW (ref 22–32)
CREATININE: 0.58 mg/dL (ref 0.44–1.00)
Calcium: 9.4 mg/dL (ref 8.9–10.3)
Chloride: 112 mmol/L — ABNORMAL HIGH (ref 98–111)
GFR calc Af Amer: >60 mL/min (ref >60)
GFR calc non Af Amer: >60 mL/min (ref >60)
Glucose, Bld: 101 mg/dL — ABNORMAL HIGH (ref 70–99)
POTASSIUM: 3.7 mmol/L (ref 3.5–5.1)
SODIUM: 142 mmol/L (ref 135–145)

## 2018-05-08 LAB — CBC
HCT: 25.6 % — ABNORMAL LOW (ref 36.0–46.0)
Hemoglobin: 8.3 g/dL — ABNORMAL LOW (ref 12.0–15.0)
MCH: 26.8 pg (ref 26.0–34.0)
MCHC: 32.4 g/dL (ref 30.0–36.0)
MCV: 82.6 fL (ref 80.0–100.0)
Platelets: 162 10*3/uL (ref 150–400)
RBC: 3.1 MIL/uL — AB (ref 3.87–5.11)
RDW: 16 % — AB (ref 11.5–15.5)
WBC: 10.2 10*3/uL (ref 4.0–10.5)
nRBC: 0.3 % — ABNORMAL HIGH (ref 0.0–0.2)

## 2018-05-08 LAB — APTT: APTT: 58 s — AB (ref 24–36)

## 2018-05-08 LAB — HEPARIN LEVEL (UNFRACTIONATED): HEPARIN UNFRACTIONATED: 0.35 [IU]/mL (ref 0.30–0.70)

## 2018-05-08 MED ORDER — IOHEXOL 300 MG/ML  SOLN
100.0000 mL | Freq: Once | INTRAMUSCULAR | Status: AC | PRN
  Administered 2018-05-08: 100 mL via INTRAVENOUS

## 2018-05-08 MED ORDER — METOCLOPRAMIDE HCL 5 MG/ML IJ SOLN
10.0000 mg | Freq: Once | INTRAMUSCULAR | Status: AC
  Administered 2018-05-08: 10 mg via INTRAVENOUS
  Filled 2018-05-08: qty 2

## 2018-05-08 MED ORDER — BISACODYL 10 MG RE SUPP: 10.0000 mg | Freq: Every day | RECTAL | Status: DC | PRN

## 2018-05-08 NOTE — Progress Notes (Signed)
Pt c/o headache unrelieved with prn tylenol. MD on-call notified and new order received. Will continue to closely monitor pt. Delia Heady RN

## 2018-05-08 NOTE — Progress Notes (Signed)
ANTICOAGULATION CONSULT NOTE - Initial Consult  Pharmacy Consult for heparin Indication: stroke  No Known Allergies  Patient Measurements: Height: 5\' 2"  (157.5 cm) Weight: 181 lb (82.1 kg) IBW/kg (Calculated) : 50.1 Heparin Dosing Weight: 68.5 kg  Vital Signs: Temp: 98.7 F (37.1 C) (10/15 0733) Temp Source: Oral (10/15 0733) BP: 132/65 (10/15 0733) Pulse Rate: 60 (10/15 0733)  Labs: Recent Labs    05/05/18 2031 05/06/18 0609 05/07/18 0457 05/07/18 0650 05/08/18 0721  HGB 9.8* 8.0* 6.1* 5.8* 8.3*  HCT 30.1* 24.3* 19.1* 18.5* 25.6*  PLT 196 171 143*  --  162  APTT 37*  --   --   --   --   LABPROT 16.3*  --   --   --   --   INR 1.32  --   --   --   --   HEPARINUNFRC  --   --   --   --  0.35  CREATININE 0.83 0.68 0.67  --  0.58    Estimated Creatinine Clearance: 58.5 mL/min (by C-G formula based on SCr of 0.58 mg/dL).   Medical History: Past Medical History:  Diagnosis Date  . A-fib ([place])   . Pacemaker     Medications:  Medications Prior to Admission  Medication Sig Dispense Refill Last Dose  . amLODipine (NORVASC) 5 MG tablet Take 5 mg by mouth daily.   05/05/2018 at Unknown time  . apixaban (ELIQUIS) 5 MG TABS tablet Take 5 mg by mouth 2 (two) times daily.   05/05/2018 at pm  . diltiazem (CARDIZEM SR) 120 MG 12 hr capsule Take 120 mg by mouth daily.   05/05/2018 at Unknown time  . flecainide (TAMBOCOR) 150 MG tablet Take 150 mg by mouth 2 (two) times daily.   05/05/2018 at Unknown time  . fluticasone (FLONASE) 50 MCG/ACT nasal spray Place 1 spray into both nostrils daily.   unknown at prn  . guaiFENesin (MUCINEX) 600 MG 12 hr tablet Take 600 mg by mouth every 12 (twelve) hours. **For 14 days**   05/05/2018 at Unknown time  . hydrochlorothiazide (HYDRODIURIL) 25 MG tablet Take 25 mg by mouth daily.   05/05/2018 at Unknown time  . metoprolol succinate (TOPROL-XL) 25 MG 24 hr tablet Take 25 mg by mouth daily.   05/05/2018 at am  . omeprazole (PRILOSEC) 20 MG  capsule Take 20 mg by mouth daily.   05/05/2018 at Unknown time  . potassium chloride SA (K-DUR,KLOR-CON) 20 MEQ tablet Take 20 mEq by mouth 2 (two) times daily.   05/05/2018 at Unknown time  . pravastatin (PRAVACHOL) 80 MG tablet Take 80 mg by mouth daily.   05/05/2018 at Unknown time  . tiZANidine (ZANAFLEX) 2 MG tablet Take 2 mg by mouth 3 (three) times daily.   05/05/2018 at Unknown time  . traMADol (ULTRAM) 50 MG tablet Take 50 mg by mouth 2 (two) times daily as needed for moderate pain.   unknown    Assessment: 77 y/o female on apixaban PTA for Afib admitted as a code stroke. Last apixaban dose was 10/12 pm. She is s/p right MCA inferior division M2 segment s/p revascularization using mechanical thrombectomy and rescue stent placement on 10/13. Pulmonary nodules found and are suspicious for metastatic disease vs infection. Pharmacy consulted to begin IV heparin while anticoagulant and antiplatelet therapy is on hold for tissue biopsy. ABLA noted, Hgb improved to 8.3 s/p  2 units PRBC.  Baseline aPTT and heparin levels were not able to be drawn.  Heparin level therapeutic at 0.35 on 800 units/hr, 3 days after last dose of apixaban, aPTT not resulted however given time frame of last DOAC dose and initial heparin level will follow heparin level at this time.    Goal of Therapy:  Heparin level 0.3-0.5 units/ml aPTT 66-85 seconds Monitor platelets by anticoagulation protocol: Yes   Plan:  Continue heparin gtt at 800 units/hr Daily heparin level, CBC, s/s bleeding  Bertis Ruddy, PharmD Clinical Pharmacist Please check AMION for all [place] numbers 05/08/2018 10:50 AM

## 2018-05-08 NOTE — Progress Notes (Addendum)
Occupational Therapy Treatment Patient Details Name: Deanna Guerrero MRN: [id number] DOB: Jul 22, 1941 Today's Date: 05/08/2018    History of present illness Deanna Guerrero is an 77 y.o. female with past medical history of atrial fibrillation on Eliquis, recent pacemaker placement presents to the emergency room as a code stroke for left-sided weakness and neglect and facial droop. CT angiogram revealed R MT-M2 occulsion and IR was activated.   OT comments  Patient participates well.  Session focus on MOCA testing, scoring 20/30 with impairments in visuo-spatial, fluency, and delayed recall.  Noted L upper quadrant errors in letter cancellation test, and WFL line bisection test.  Patient reports blurry vision at times, but denies changes since admission.  Patient and daughter educated on use of pill box for medication mgmt and initial assistance due to decreased recall. Continue to recommend 24/7 initial supervision at dc, and plan for next session on functional cognition, memory and safety during ADLs.  Will continue to follow.    Follow Up Recommendations  Home health OT;Supervision/Assistance - 24 hour(initally)    Equipment Recommendations  3 in 1 bedside commode    Recommendations for Other Services      Precautions / Restrictions Precautions Precautions: Fall Precaution Comments: R groin pain from stent Restrictions Weight Bearing Restrictions: No       Mobility Bed Mobility               General bed mobility comments: supine to long sitting in bed with supervision  Transfers                 General transfer comment: pt deferred this session, just returned back to bed    Balance Overall balance assessment: Needs assistance   Sitting balance-Leahy Scale: Good                                     ADL either performed or assessed with clinical judgement   ADL Overall ADL's : Needs assistance/impaired     Grooming: Set up;Sitting                                  General ADL Comments: session focused on visual and cognitive assessment, using MOCA: scoring 20/30 with impairments in visuospatial, fluency, and delayed recall.  Completed letter identification with errors in L upper page only.      Vision   Additional Comments: noted L upper quadrant with letter cancellation task; [facility] line bisection test   Perception     Praxis      Cognition Arousal/Alertness: Awake/alert Behavior During Therapy: WFL for tasks assessed/performed Overall Cognitive Status: Within Functional Limits for tasks assessed                                          Exercises     Shoulder Instructions       General Comments      Pertinent Vitals/ Pain       Pain Assessment: No/denies pain  Home Living                                          Prior Functioning/Environment  Frequency  Min 2X/week        Progress Toward Goals  OT Goals(current goals can now be found in the care plan section)  Progress towards OT goals: Progressing toward goals  Acute Rehab OT Goals Patient Stated Goal: go home OT Goal Formulation: With patient Time For Goal Achievement: 05/21/18 Potential to Achieve Goals: Good  Plan Discharge plan remains appropriate;Frequency remains appropriate    Co-evaluation                 AM-PAC PT "6 Clicks" Daily Activity     Outcome Measure   Help from another person eating meals?: None Help from another person taking care of personal grooming?: None Help from another person toileting, which includes using toliet, bedpan, or urinal?: A Little Help from another person bathing (including washing, rinsing, drying)?: A Little Help from another person to put on and taking off regular upper body clothing?: None Help from another person to put on and taking off regular lower body clothing?: A Little 6 Click Score: 21    End of Session    OT  Visit Diagnosis: Unsteadiness on feet (R26.81);Other symptoms and signs involving cognitive function   Activity Tolerance Patient tolerated treatment well   Patient Left in bed;with call bell/phone within reach;with family/visitor present   Nurse Communication Mobility status        Time: 3524-8185 OT Time Calculation (min): 42 min  Charges: OT General Charges $OT Visit: 1 Visit OT Treatments $Self Care/Home Management : 38-52 mins  Delight Stare, [place] Pager [phone] Office [phone]    Delight Stare 05/08/2018, 5:35 PM

## 2018-05-08 NOTE — Progress Notes (Signed)
Patient known to NIR from recent stroke s/p intervention.  Abnormal CXR prompted CT of the Chest.  This revealed multiple cannonball lesions in the lungs and mediastinum.  PCCM consulted for assessment. At the time, patient was on Brilinta. Dr. Estanislado Pandy has since approved patient to hold Brilinta for biopsy if needed. Currently on heparin drip. Reviewed imaging with Dr. Kathlene Cote who recommends additional imaging to look for primary and/or more accessible lesion. Recommends CT Abdomen/Pelvis or pursuing PET as an outpatient which was recommended by Pulmonology.   Brynda Greathouse, MS RD PA-C 8:54 AM

## 2018-05-08 NOTE — Care Management Note (Signed)
Case Management Note  Patient Details  Name: Deanna Guerrero MRN: [id number] Date of Birth: 1941/03/04  Subjective/Objective:     Pt admitted with stroke. She is from home alone. Pt was very IADL prior to admission. Pt states she has a daughter that lives across the street that can provide some assist. She also has a daughter that is not currently working that could stay with her 24/7.  Pt denies transportation issues.  She currently doesn't have issues affording her meds but states if many are added she may have trouble affording them. Pt doesn't have any DME at home.  PCP: Dr Doneen Poisson [facility]acy: [facility] in [place]              Action/Plan: Recommendations are for [facility] services and DME. CM provided her a list of [place] agencies. CM will follow.   Expected Discharge Date:                  Expected Discharge Plan:  [place]  In-House Referral:     Discharge planning Services  CM Consult, [place]  Post Acute Care Choice:    Choice offered to:  Patient  DME Arranged:  3-N-1, [place] rolling DME Agency:  [place]:    [place] Agency:     Status of Service:  In process, will continue to follow  If discussed at Long Length of Stay Meetings, dates discussed:    Additional Comments:  [name], RN 05/08/2018, 1:45 PM

## 2018-05-08 NOTE — Progress Notes (Signed)
STROKE TEAM PROGRESS NOTE   SUBJECTIVE (INTERVAL HISTORY) No family at bedside. Patient up in chair.  He has no neurological complaints. I have discussed her CT scan of the chest results with pulmonary physicians and interventional radiology team. The plan is to do biopsy 3-5 days after stopping Brilinta and end of the week. Patient will stay on IV heparin till a few hours before the biopsy. OBJECTIVE Vitals:   05/07/18 2343 05/08/18 0320 05/08/18 0733 05/08/18 1204  BP: (!) 119/59 (!) 155/66 132/65 (!) 125/55  Pulse: (!) 58 (!) 59 60 (!) 59  Resp: 20 20 20 20   Temp: 98.8 F (37.1 C) 98.7 F (37.1 C) 98.7 F (37.1 C) 97.8 F (36.6 C)  TempSrc: Oral Oral Oral Oral  SpO2: 100% 94% 100% 100%  Weight:      Height:        CBC:  Recent Labs  Lab 05/05/18 2031 05/06/18 0609 05/07/18 0457 05/07/18 0650 05/08/18 0721  WBC 8.9 10.6* 8.5  --  10.2  NEUTROABS 6.9 9.5*  --   --   --   HGB 9.8* 8.0* 6.1* 5.8* 8.3*  HCT 30.1* 24.3* 19.1* 18.5* 25.6*  MCV 80.9 80.5 82.0  --  82.6  PLT 196 171 143*  --  426    Basic Metabolic Panel:  Recent Labs  Lab 05/07/18 0457 05/08/18 0721  NA 141 142  K 3.2* 3.7  CL 112* 112*  CO2 22 20*  GLUCOSE 100* 101*  BUN 11 8  CREATININE 0.67 0.58  CALCIUM 8.9 9.4    IMAGING Ct Head Code Stroke Wo Contrast 05/05/2018 1. Atrophy and small vessel disease. No acute intracranial findings. No signs of large vessel occlusion.  2. ASPECTS is 10.   Ct Angio Head W Or Wo Contrast Ct Angio Neck W Or Wo Contrast 05/05/2018 No extracranial stenosis or dissection. BILATERAL carotid atheromatous change. 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 Innumerable subcentimeter pulmonary nodules, which could represent infection or metastatic disease. CT chest with contrast could provide additional information.   Cerebral Angiogram 05/06/2018 S/P RT common carotid arteriogram followed by endovascular revascularization of RT MCA INF division M2  branch with x 3 passes with embotrap 89mm x 33 mm retriever device and X1 pass with  solitaire Fr 55mm x 40 mm retriever device achieving a TICI 2 b revascularization, with rescue stent placement for reocclusion achieving a TICI 3 revascularization.  Ct Head Wo Contrast 05/06/2018 RIGHT sylvian fissure subarachnoid hemorrhage status post endovascular treatment for RIGHT MCA M2 occlusion. This corresponds to [place] bleeding classification 3C. No evidence for hemorrhagic infarction or parenchymal hematoma. Developing hypoattenuation, RIGHT temporal lobe suspected early cytotoxic edema.   Ct Chest W Contrast 05/06/2018 1. 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 2. Abnormal appearance of the abdominal retroperitoneum with soft tissue surrounding the proximal celiac. Although this appearance typically is seen with pancreatic carcinoma, given absence of symptoms or abdominal complaints, lymphoma or upper abdominal metastasis from an unknown primary are considerations. 3. Consider PET and/or dedicated abdominopelvic contrast-enhanced CT. 4. Small hiatal hernia. 5. Small right pleural effusion. 6. Coronary artery atherosclerosis. Aortic Atherosclerosis (ICD10-I70.0). 7. Pulmonary artery enlargement suggests pulmonary arterial hypertension. 8. Small volume abdominal ascites. 9. Peripancreatic edema for which superimposed pancreatitis cannot be excluded.    Transthoracic Echocardiogram  - Left ventricle: The cavity size was normal. Wall thickness was increased in a pattern of mild LVH. Systolic function was vigorous. The estimated ejection fraction  was in the range of 65% to 70%. Wall motion was normal; there were no regional wall motion abnormalities. Doppler parameters are consistent with abnormal left ventricular relaxation (grade 1 diastolic dysfunction). The Ee&' ratio is between 8-15 suggesting indeterminate LV filling pressure. - Aortic valve: Sclerosis without stenosis.  There was mild regurgitation. - Left atrium: The atrium was normal in size. - Right ventricle: The cavity size was normal. Wall thickness was normal. Pacer wire or catheter noted in right ventricle. Systolic function was normal. - Right atrium: The atrium was normal in size. Pacer wire or catheter noted in right atrium. - Tricuspid valve: There was trivial regurgitation. - Pulmonary arteries: PA peak pressure: 25 mm Hg (S). - Inferior vena cava: The vessel was normal in size. The respirophasic diameter changes were in the normal range (>= 50%), consistent with normal central venous pressure. Impressions:   LVEF 65-70%, mild LVH, normal wall motion, grade 1 DD, indeterminate LV filling pressure, aortic sclerosis with mild AI, trivial TR, RVSP 25 mmHg, normal IVC.   PHYSICAL EXAM elderly african [place] lady who is awake and alert. . Afebrile. Head is nontraumatic. Neck is supple without bruit. Cardiac exam no murmur or gallop. Lungs are clear to auscultation. Distal pulses are well felt. Neurological Exam :She is alert and oriented to self, place, month, year, appears to be a good historian,  Following commands, moves all extremities to command without drift, sensation intact, EOMI, PERRL, blinks to threat bilat, face symmetric, tongue midline,spontaneous movements x4, toes equiv, no clonus.  Gait: deferred ASSESSMENT/PLAN Deanna Guerrero is a 77 y.o. female with history of atrial fibrillation on Eliquis, recent pacemaker placement  presenting with left-sided weakness, neglect and facial droop. She did not receive IV t-PA due to anticoagulation. S/P RT MCA INF division M2 branch mechanical thrombectomy and rescue stent  Stroke:  R temporal lobe infarct s/p IR w/ TICI3 revascularization and R MCA stent placement, infarct embolic d/t known AF while on anticoagulation  CT head - Atrophy and small vessel disease. ASPECTS 10  CTA H & N R M2 occlusion 42mm from bifurcation. innumerable  subcentimeter pulm nodules infection vs mets.   Angio endovascular revascularization of RT MCA INF division M2 branch with x 3 passes with embotrap 20mm x 33 mm retriever device and X1 pass with  solitaire Fr 69mm x 40 mm retriever device achieving a TICI 2 b revascularization, with rescue stent placement for reocclusion achieving a TICI 3 revascularization.  MRI/MRA head - recent ppm placement  2D Echo - EF 65-70%. No source of embolus. Aortic sclerosis.  Repeat CT head R temporal lobe edema, R sylvian fissure SAH sp IR  LDL - 103  HgbA1c - 4.8  VTE prophylaxis - SCDs  Eliquis (apixaban) daily prior to admission, now on aspirin 81 mg daily and Brilinta 90 mg BID  Therapy recommendations:  HH PT  Disposition:  Pending  Atrial Fibrillation w/ RVR . Home anticoagulation:  Eliquis (apixaban) daily  . No eliquis at this time d/t SAH post IR . Resume home Cardizem, Toprol and flecainide   Hypertension  Stable  Treated w/ Cleviprex in ICU post IR, now off . Home meds:  norvasc 5, cardizem 120, HCTZ 25, toprol 25 . Now 120-150s . Follow up BP in am, slowly resume home meds . 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   Chest CT with contrast - Widespread metastatic disease to the lungs and thoracic nodal stations. Abnormal appearance of the abdominal retroperitoneum with soft tissue surrounding the proximal celiac typically is seen with pancreatic carcinoma, Small volume  abdominal ascites. Peripancreatic edema for which superimposed pancreatitis cannot be excluded.    Dr. Leonie Man discussed findings w/ pt and dtr  Pulmonary consulted  Unable to perform bronch, bx given inability to stop antiplatelets at this time  Needs OP PET scan- dx multiple pulm nodules  Other Active Problems   Hypokalemia - 3.3 - supplement  Anemia - Hb 9.8 -> 8.0 - monitor for retroperitoneal hematoma  Mild leukocytosis - 10.6  Abdominal CT scan of the chest with widespread metastasis disease to the lungs and thoracic lymph nodes. Normal abdominal retroperitoneum with soft tissue surrounding the proximal celiac   Hospital day # 3       The patient is doing quite well from stroke standpoint and has made excellent neurological recovery but unfortunately chest x-ray and CT scan of the chest are abnormal showing multiple pulmonary and pleural nodules possibly metastasis from GI primary. Patient will need tissue biopsy prior to considering chemotherapy or radiation. She   needs to be on antiplatelet therapy for her right MCA stent but biopsy cannot be done while she is on Brilinta.plan to check CT scan of the abdomen and pelvis to further identify primary cancer and tissue best to be biopsied easily. Have discussed the challenging situation with Dr. Estanislado Pandy as well as with pulmonologist Dr. Nelda Marseille. I think the best course for him would be to stop Brilinta and switch the patient IV heparin which can BE held a few hours prior to planned biopsy. Will consult interventional radiology to see if they could do this in the next couple of days. Long discussion with the patient  at the bedside and discuss plan of care and answered questions. Greater than 50% time during this 25 minute visit was spent on counseling and coordination of care about his stroke, metastatic disease and need for further diagnostic testing and answered questions    Antony Contras, MD Medical Director [place] Pager: [phone] 05/08/2018 12:35 PM  To contact Stroke Continuity provider, please refer to [email or web address] After hours, contact General Neurology

## 2018-05-08 NOTE — Telephone Encounter (Signed)
As of today, the pt is still admitted.

## 2018-05-08 NOTE — Progress Notes (Addendum)
Physical Therapy Treatment Patient Details Name: Deanna Guerrero MRN: [id number] DOB: July 20, 1941 Today's Date: 05/08/2018    History of Present Illness Deanna Guerrero is an 77 y.o. female with past medical history of atrial fibrillation on Eliquis, recent pacemaker placement presents to the emergency room as a code stroke for left-sided weakness and neglect and facial droop. CT angiogram revealed R MT-M2 occulsion and IR was activated.    PT Comments    Pt performed gait training and functional mobility with use of RW throughout.  She is requiring min guard assistance.  At this time continue to recommend HHPT and 24 hour assistance.  She may progress given she will be here through Friday.  Plan next session for higher level balance activities to tolerance.     Follow Up Recommendations  Home health PT;Supervision/Assistance - 24 hour(initially.  )     Equipment Recommendations  Rolling walker with 5" wheels    Recommendations for Other Services       Precautions / Restrictions Precautions Precautions: Fall Precaution Comments: R groin pain from stent Restrictions Weight Bearing Restrictions: No    Mobility  Bed Mobility Overal bed mobility: Needs Assistance Bed Mobility: Supine to Sit     Supine to sit: Supervision     General bed mobility comments: No assistance needed just increased time to progress to edge of bed due to pain in R groin,    Transfers Overall transfer level: Needs assistance Equipment used: Rolling walker (2 wheeled) Transfers: Sit to/from Stand Sit to Stand: Min guard         General transfer comment: Cues for hand placement to and from seated surface.  Pt requiring decreased assistance from prior sessions.    Ambulation/Gait Ambulation/Gait assistance: Min guard Gait Distance (Feet): 100 Feet Assistive device: Rolling walker (2 wheeled) Gait Pattern/deviations: Step-through pattern;Decreased stride length     General Gait Details: No  complaints of dizziness and able to progress gait further.  She does present with some L inattention and required cues for visually scanning oath to avoid obstacles before coming into contact.  Gait does not appear antalgic today.     Stairs             Wheelchair Mobility    Modified Rankin (Stroke Patients Only) Modified Rankin (Stroke Patients Only) Pre-Morbid Rankin Score: No symptoms Modified Rankin: Moderately severe disability     Balance Overall balance assessment: Needs assistance   Sitting balance-Leahy Scale: Good       Standing balance-Leahy Scale: Poor                              Cognition Arousal/Alertness: Awake/alert Behavior During Therapy: WFL for tasks assessed/performed Overall Cognitive Status: Within Functional Limits for tasks assessed                                 General Comments: Not formally assessed but within functional limitations during session.        Exercises      General Comments        Pertinent Vitals/Pain Pain Assessment: Faces Faces Pain Scale: Hurts even more Pain Location: R groin with movement from procedure.   Pain Descriptors / Indicators: Sore Pain Intervention(s): Monitored during session;Repositioned    Home Living  Prior Function            PT Goals (current goals can now be found in the care plan section) Acute Rehab PT Goals Patient Stated Goal: go home Potential to Achieve Goals: Good Progress towards PT goals: Progressing toward goals    Frequency    Min 4X/week      PT Plan Current plan remains appropriate    Co-evaluation              AM-PAC PT "6 Clicks" Daily Activity  Outcome Measure  Difficulty turning over in bed (including adjusting bedclothes, sheets and blankets)?: Unable Difficulty moving from lying on back to sitting on the side of the bed? : Unable Difficulty sitting down on and standing up from a chair with  arms (e.g., wheelchair, bedside commode, etc,.)?: Unable Help needed moving to and from a bed to chair (including a wheelchair)?: A Little Help needed walking in hospital room?: A Little Help needed climbing 3-5 steps with a railing? : A Little 6 Click Score: 12    End of Session Equipment Utilized During Treatment: Gait belt Activity Tolerance: Patient tolerated treatment well Patient left: in chair;with call bell/phone within reach;with nursing/sitter in room Nurse Communication: Mobility status PT Visit Diagnosis: Unsteadiness on feet (R26.81)     Time: 3291-9166 PT Time Calculation (min) (ACUTE ONLY): 27 min  Charges:  $Gait Training: 8-22 mins $Therapeutic Activity: 8-22 mins                     Governor Rooks, PTA Acute Rehabilitation Services Pager [phone] Office [place] Isia[place]ollen[place] 05/08/2018, 11:26 AM

## 2018-05-08 NOTE — Addendum Note (Signed)
Addendum  created 05/08/18 1637 by Zeyna Mkrtchyan, Tollie Eth, CRNA   Intraprocedure Event edited, Sign clinical note

## 2018-05-09 ENCOUNTER — Inpatient Hospital Stay (HOSPITAL_COMMUNITY): Payer: Medicare Other

## 2018-05-09 DIAGNOSIS — C7802 Secondary malignant neoplasm of left lung: Secondary | ICD-10-CM

## 2018-05-09 DIAGNOSIS — E785 Hyperlipidemia, unspecified: Secondary | ICD-10-CM

## 2018-05-09 DIAGNOSIS — I729 Aneurysm of unspecified site: Secondary | ICD-10-CM

## 2018-05-09 DIAGNOSIS — E876 Hypokalemia: Secondary | ICD-10-CM

## 2018-05-09 DIAGNOSIS — C7801 Secondary malignant neoplasm of right lung: Secondary | ICD-10-CM

## 2018-05-09 DIAGNOSIS — T81718A Complication of other artery following a procedure, not elsewhere classified, initial encounter: Secondary | ICD-10-CM

## 2018-05-09 HISTORY — PX: IR US GUIDE BX ASP/DRAIN: IMG2392

## 2018-05-09 LAB — CBC
HCT: 24.5 % — ABNORMAL LOW (ref 36.0–46.0)
Hemoglobin: 8 g/dL — ABNORMAL LOW (ref 12.0–15.0)
MCH: 27.3 pg (ref 26.0–34.0)
MCHC: 32.7 g/dL (ref 30.0–36.0)
MCV: 83.6 fL (ref 80.0–100.0)
NRBC: 0 % (ref 0.0–0.2)
PLATELETS: 175 10*3/uL (ref 150–400)
RBC: 2.93 MIL/uL — ABNORMAL LOW (ref 3.87–5.11)
RDW: 16.3 % — AB (ref 11.5–15.5)
WBC: 8.7 10*3/uL (ref 4.0–10.5)

## 2018-05-09 LAB — BASIC METABOLIC PANEL
ANION GAP: 8 (ref 5–15)
BUN: 6 mg/dL — ABNORMAL LOW (ref 8–23)
CALCIUM: 9.5 mg/dL (ref 8.9–10.3)
CO2: 23 mmol/L (ref 22–32)
Chloride: 110 mmol/L (ref 98–111)
Creatinine, Ser: 0.54 mg/dL (ref 0.44–1.00)
Glucose, Bld: 100 mg/dL — ABNORMAL HIGH (ref 70–99)
Potassium: 3.4 mmol/L — ABNORMAL LOW (ref 3.5–5.1)
Sodium: 141 mmol/L (ref 135–145)

## 2018-05-09 LAB — HEPARIN LEVEL (UNFRACTIONATED): Heparin Unfractionated: 0.2 IU/mL — ABNORMAL LOW (ref 0.30–0.70)

## 2018-05-09 LAB — AFP TUMOR MARKER: AFP, SERUM, TUMOR MARKER: 3.9 ng/mL (ref 0.0–8.3)

## 2018-05-09 LAB — CANCER ANTIGEN 19-9: CAN 19-9: 19182 U/mL — AB (ref 0–35)

## 2018-05-09 LAB — CEA: CEA: 30.5 ng/mL — ABNORMAL HIGH (ref 0.0–4.7)

## 2018-05-09 MED ORDER — HEPARIN (PORCINE) IN NACL 100-0.45 UNIT/ML-% IJ SOLN: 950.0000 [IU]/h | INTRAMUSCULAR | Status: DC

## 2018-05-09 MED ORDER — THROMBIN 5000 UNITS EX SOLR
CUTANEOUS | Status: AC | PRN
  Administered 2018-05-09: 700 [IU] via TOPICAL

## 2018-05-09 MED ORDER — SODIUM CHLORIDE 0.9 % IV SOLN
INTRAVENOUS | Status: AC | PRN
  Administered 2018-05-09: 10 mL/h via INTRAVENOUS

## 2018-05-09 MED ORDER — MIDAZOLAM HCL 2 MG/2ML IJ SOLN
INTRAMUSCULAR | Status: AC
  Filled 2018-05-09: qty 2

## 2018-05-09 MED ORDER — LIDOCAINE HCL 1 % IJ SOLN
INTRAMUSCULAR | Status: AC
  Filled 2018-05-09: qty 20

## 2018-05-09 MED ORDER — FENTANYL CITRATE (PF) 100 MCG/2ML IJ SOLN
INTRAMUSCULAR | Status: AC | PRN
  Administered 2018-05-09 (×2): 50 ug via INTRAVENOUS

## 2018-05-09 MED ORDER — LIDOCAINE HCL 1 % IJ SOLN
INTRAMUSCULAR | Status: AC | PRN
  Administered 2018-05-09: 5 mL

## 2018-05-09 MED ORDER — BUTALBITAL-APAP-CAFFEINE 50-325-40 MG PO TABS
1.0000 | ORAL_TABLET | Freq: Three times a day (TID) | ORAL | Status: DC | PRN
  Administered 2018-05-10: 1 via ORAL
  Filled 2018-05-09: qty 1

## 2018-05-09 MED ORDER — FENTANYL CITRATE (PF) 100 MCG/2ML IJ SOLN
INTRAMUSCULAR | Status: AC
  Filled 2018-05-09: qty 2

## 2018-05-09 MED ORDER — POTASSIUM CHLORIDE CRYS ER 20 MEQ PO TBCR
40.0000 meq | EXTENDED_RELEASE_TABLET | ORAL | Status: AC
  Administered 2018-05-09 (×2): 40 meq via ORAL
  Filled 2018-05-09 (×3): qty 2

## 2018-05-09 MED ORDER — MIDAZOLAM HCL 2 MG/2ML IJ SOLN
INTRAMUSCULAR | Status: AC | PRN
  Administered 2018-05-09 (×2): 1 mg via INTRAVENOUS

## 2018-05-09 NOTE — Progress Notes (Signed)
ANTICOAGULATION CONSULT NOTE - Initial Consult  Pharmacy Consult for heparin Indication: stroke  No Known Allergies  Patient Measurements: Height: 5\' 2"  (157.5 cm) Weight: 181 lb (82.1 kg) IBW/kg (Calculated) : 50.1 Heparin Dosing Weight: 68.5 kg  Vital Signs: Temp: 98.5 F (36.9 C) (10/16 0730) Temp Source: Oral (10/16 0730) BP: 153/62 (10/16 0730) Pulse Rate: 65 (10/16 0730)  Labs: Recent Labs    05/07/18 0457 05/07/18 0650 05/08/18 0721 05/08/18 1412 05/09/18 0605  HGB 6.1* 5.8* 8.3*  --  8.0*  HCT 19.1* 18.5* 25.6*  --  24.5*  PLT 143*  --  162  --  175  APTT  --   --   --  58*  --   HEPARINUNFRC  --   --  0.35  --  0.20*  CREATININE 0.67  --  0.58  --  0.54    Estimated Creatinine Clearance: 58.5 mL/min (by C-G formula based on SCr of 0.54 mg/dL).   Medical History: Past Medical History:  Diagnosis Date  . A-fib ([place])   . Pacemaker     Medications:  Medications Prior to Admission  Medication Sig Dispense Refill Last Dose  . amLODipine (NORVASC) 5 MG tablet Take 5 mg by mouth daily.   05/05/2018 at Unknown time  . apixaban (ELIQUIS) 5 MG TABS tablet Take 5 mg by mouth 2 (two) times daily.   05/05/2018 at pm  . diltiazem (CARDIZEM SR) 120 MG 12 hr capsule Take 120 mg by mouth daily.   05/05/2018 at Unknown time  . flecainide (TAMBOCOR) 150 MG tablet Take 150 mg by mouth 2 (two) times daily.   05/05/2018 at Unknown time  . fluticasone (FLONASE) 50 MCG/ACT nasal spray Place 1 spray into both nostrils daily.   unknown at prn  . guaiFENesin (MUCINEX) 600 MG 12 hr tablet Take 600 mg by mouth every 12 (twelve) hours. **For 14 days**   05/05/2018 at Unknown time  . hydrochlorothiazide (HYDRODIURIL) 25 MG tablet Take 25 mg by mouth daily.   05/05/2018 at Unknown time  . metoprolol succinate (TOPROL-XL) 25 MG 24 hr tablet Take 25 mg by mouth daily.   05/05/2018 at am  . omeprazole (PRILOSEC) 20 MG capsule Take 20 mg by mouth daily.   05/05/2018 at Unknown time  .  potassium chloride SA (K-DUR,KLOR-CON) 20 MEQ tablet Take 20 mEq by mouth 2 (two) times daily.   05/05/2018 at Unknown time  . pravastatin (PRAVACHOL) 80 MG tablet Take 80 mg by mouth daily.   05/05/2018 at Unknown time  . tiZANidine (ZANAFLEX) 2 MG tablet Take 2 mg by mouth 3 (three) times daily.   05/05/2018 at Unknown time  . traMADol (ULTRAM) 50 MG tablet Take 50 mg by mouth 2 (two) times daily as needed for moderate pain.   unknown    Assessment: 77 y/o female on apixaban PTA for Afib admitted as a code stroke. Last apixaban dose was 10/12 pm. She is s/p right MCA inferior division M2 segment s/p revascularization using mechanical thrombectomy and rescue stent placement on 10/13. Pulmonary nodules found and are suspicious for metastatic disease vs infection. Pharmacy consulted to begin IV heparin while anticoagulant and antiplatelet therapy is on hold for tissue biopsy. ABLA noted, Hgb improved to 8.3 s/p  2 units PRBC.  Baseline aPTT and heparin levels were not able to be drawn.  Heparin level subtherapeutic at 0.20 on 800 units/hr, CBC stable since pRBC transfusion and no bleeding noted.    Goal of Therapy:  Heparin level 0.3-0.5 units/ml aPTT 66-85 seconds Monitor platelets by anticoagulation protocol: Yes   Plan:  Increase heparin gtt to 950 units/hr F/u 8 hour heparin level Daily heparin level, CBC, s/s bleeding F/u heparin interruption for biopsy   Bertis Ruddy, PharmD Clinical Pharmacist Please check AMION for all [place] numbers 05/09/2018 8:53 AM

## 2018-05-09 NOTE — Progress Notes (Addendum)
STROKE TEAM PROGRESS NOTE   SUBJECTIVE (INTERVAL HISTORY) Multiple nursing students at bedside for medication administration.  New HA - intermittent throbbing, back of head. Will give fiorecet.  CTA&P w/ likely pancreatic cancer and lung mets. For bx today. 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 Had lots of bleeding with sheath removal. Pt states progressively less sore but she can feel it when she tries to get OOB. VVS consulted.  OBJECTIVE Vitals:   05/08/18 2354 05/09/18 0345 05/09/18 0730 05/09/18 1103  BP: (!) 144/61 138/60 (!) 153/62 (!) 146/68  Pulse: 66 61 65 67  Resp: 18 20 20 18   Temp: 98.1 F (36.7 C) 98 F (36.7 C) 98.5 F (36.9 C) 98.3 F (36.8 C)  TempSrc: Oral Oral Oral Oral  SpO2: 98% 98% 100% 96%  Weight:      Height:        CBC:  Recent Labs  Lab 05/05/18 2031 05/06/18 0609  05/08/18 0721 05/09/18 0605  WBC 8.9 10.6*   < > 10.2 8.7  NEUTROABS 6.9 9.5*  --   --   --   HGB 9.8* 8.0*   < > 8.3* 8.0*  HCT 30.1* 24.3*   < > 25.6* 24.5*  MCV 80.9 80.5   < > 82.6 83.6  PLT 196 171   < > 162 175   < > = values in this interval not displayed.    Basic Metabolic Panel:  Recent Labs  Lab 05/08/18 0721 05/09/18 0605  NA 142 141  K 3.7 3.4*  CL 112* 110  CO2 20* 23  GLUCOSE 101* 100*  BUN 8 6*  CREATININE 0.58 0.54  CALCIUM 9.4 9.5    IMAGING Ct Head Code Stroke Wo Contrast 05/05/2018 1. Atrophy and small vessel disease. No acute intracranial findings. No signs of large vessel occlusion.  2. ASPECTS is 10.   Ct Angio Head W Or Wo Contrast Ct Angio Neck W Or Wo Contrast 05/05/2018 No extracranial stenosis or dissection. BILATERAL carotid atheromatous change. 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 Innumerable subcentimeter pulmonary nodules, which could represent infection or metastatic disease. CT chest with contrast could provide additional information.   Cerebral Angiogram 05/06/2018 S/P RT common carotid arteriogram  followed by endovascular revascularization of RT MCA INF division M2 branch with x 3 passes with embotrap 69mm x 33 mm retriever device and X1 pass with  solitaire Fr 84mm x 40 mm retriever device achieving a TICI 2 b revascularization, with rescue stent placement for reocclusion achieving a TICI 3 revascularization.  Ct Head Wo Contrast 05/06/2018 RIGHT sylvian fissure subarachnoid hemorrhage status post endovascular treatment for RIGHT MCA M2 occlusion. This corresponds to [place] bleeding classification 3C. No evidence for hemorrhagic infarction or parenchymal hematoma. Developing hypoattenuation, RIGHT temporal lobe suspected early cytotoxic edema.   Ct Chest W Contrast 05/06/2018 1. 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 2. Abnormal appearance of the abdominal retroperitoneum with soft tissue surrounding the proximal celiac. Although this appearance typically is seen with pancreatic carcinoma, given absence of symptoms or abdominal complaints, lymphoma or upper abdominal metastasis from an unknown primary are considerations. 3. Consider PET and/or dedicated abdominopelvic contrast-enhanced CT. 4. Small hiatal hernia. 5. Small right pleural effusion. 6. Coronary artery atherosclerosis. Aortic Atherosclerosis (ICD10-I70.0). 7. Pulmonary artery enlargement suggests pulmonary arterial hypertension. 8. Small volume abdominal ascites. 9. Peripancreatic edema for which superimposed pancreatitis cannot be excluded.    Ct Abdomen Pelvis W Contrast 05/08/2018 Diffuse peripancreatic edema with  infiltration of the celiac axis and para-aortic regions associated with peripancreatic and LEFT para-aortic adenopathy, question neoplastic from a poorly defined pancreatic head mass versus infiltrative celiac tumor or inflammatory from pancreatitis. Minimal intrahepatic biliary dilatation LEFT lobe liver of uncertain etiology. Cystic collection at the pancreatic head 24 x 17 x 19 mm  could represent a cystic pancreatic neoplasm, pancreatic pseudocyst, or choledochal cyst. Extensive bibasilar pulmonary metastases and small RIGHT pleural effusion. Tiny nonspecific LEFT adrenal nodule 12 x 9 mm. 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   Ct Head Wo Contrast 05/09/2018 1. New subcentimeter focus of hypoattenuation within right mid corona radiata compatible with evolving subacute infarction. 2. Stable subcentimeter cortical hemorrhage within right temporal lobe and small volume of subarachnoid hemorrhage within right Sylvian fissure. 3. No new intracranial hemorrhage identified. 4. Stable chronic microvascular ischemic changes and volume loss of the brain.   Transthoracic Echocardiogram  - Left ventricle: The cavity size was normal. Wall thickness was increased in a pattern of mild LVH. Systolic function was vigorous. The estimated ejection fraction was in the range of 65% to 70%. Wall motion was normal; there were no regional wall motion abnormalities. Doppler parameters are consistent with abnormal left ventricular relaxation (grade 1 diastolic dysfunction). The Ee&' ratio is between 8-15 suggesting indeterminate LV filling pressure. - Aortic valve: Sclerosis without stenosis. There was mild regurgitation. - Left atrium: The atrium was normal in size. - Right ventricle: The cavity size was normal. Wall thickness was normal. Pacer wire or catheter noted in right ventricle. Systolic function was normal. - Right atrium: The atrium was normal in size. Pacer wire or catheter noted in right atrium. - Tricuspid valve: There was trivial regurgitation. - Pulmonary arteries: PA peak pressure: 25 mm Hg (S). - Inferior vena cava: The vessel was normal in size. The respirophasic diameter changes were in the normal range (>= 50%), consistent with normal central venous pressure. Impressions:   LVEF 65-70%, mild LVH, normal wall  motion, grade 1 DD, indeterminate LV filling pressure, aortic sclerosis with mild AI, trivial TR, RVSP 25 mmHg, normal IVC.   PHYSICAL EXAM Talkative elderly african [place] lady who is awake and alert. Afebrile. Head is nontraumatic. Neck is supple without bruit.Cardiac exam no murmur or gallop. Lungs are clear to auscultation. Distal pulses are well felt. R groin with pelvic bruising, firm and rounded beneath R groin dressing which is CD&I. Neurological Exam :She is alert and oriented to self, place, month, year, appears to be a good historian,  Following commands, moves all extremities to command without drift, sensation intact, EOMI, PERRL, blinks to threat bilat, decreased LUQ vision, face symmetric, tongue midline,spontaneous movements x4, strength 5/5 bilaterally. toes equiv, no clonus. FMM intact. Gait: deferred  ASSESSMENT/PLAN [name] is a 77 y.o. female with history of atrial fibrillation on Eliquis, recent pacemaker placement  presenting with left-sided weakness, neglect and facial droop. She did not receive IV t-PA due to anticoagulation. S/P RT MCA INF division M2 branch mechanical thrombectomy and rescue stent  Stroke:  R temporal lobe infarct s/p IR w/ TICI3 revascularization and R MCA stent placement, infarct embolic d/t known AF while on anticoagulation vs. Hypercoagulable state with advanced malignancy.   CT head - Atrophy and small vessel disease. ASPECTS 10  CTA H & N R M2 occlusion 68mm from bifurcation. innumerable subcentimeter pulm nodules infection vs mets.   Angio endovascular revascularization of RT MCA INF division  M2 branch with x 3 passes with embotrap 53mm x 33 mm retriever device and X1 pass with  solitaire Fr 58mm x 40 mm retriever device achieving a TICI 2 b revascularization, with rescue stent placement for reocclusion achieving a TICI 3 revascularization.  MRI/MRA head - recent ppm placement  2D Echo - EF 65-70%. No source of embolus. Aortic  sclerosis.  Repeat CT head R temporal lobe edema, R sylvian fissure SAH sp IR  Repeat CT head 10/16 - R CR infarct. Stable R temporal hmg/SAH. No new bleeding.   LDL - 103  HgbA1c - 4.8  VTE prophylaxis - SCDs  Eliquis (apixaban) daily prior to admission, now on aspirin 81 mg daily and heparin IV (Brilinta stopped w/ plan for bx). Heparin on hold during rounds for bx this afternoon  Therapy recommendations:  HH PT  Disposition:  Pending  Atrial Fibrillation w/ RVR . Home anticoagulation:  Eliquis (apixaban) daily  . No eliquis at this time d/t SAH post IR . Resume home Cardizem, Toprol and flecainide   Hypertension  Stable  Treated w/ Cleviprex in ICU post IR, now off . Home meds:  norvasc 5, cardizem 120, HCTZ 25, toprol 25, flecainide  . Now 130-150s . 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   Chest CT with contrast - Widespread metastatic disease to the lungs and thoracic nodal stations. Abnormal appearance of the abdominal retroperitoneum with soft tissue surrounding the proximal celiac typically is seen with pancreatic carcinoma, Small volume abdominal ascites. Peripancreatic edema for which superimposed pancreatitis cannot be excluded.    Dr. Leonie Man discussed findings w/ pt and dtr  Pulmonary consulted  CT abd & pelvis - pancreatic mass, lung mets, R inguinal retroperitoneal aneurysm  For lung bx today  Needs OP PET scan- dx multiple  pulm nodules  R groin Pseudoaneurysm  See on CT abd and pelvis  Pt w/ lots of bleeding post sheath removal, requiring transfusion  VVS consulted  Other Active Problems   Hypokalemia - 3.4 - supplement  Anemia - Hb 9.8 -> 8.0 - monitor for retroperitoneal hematoma  Mild leukocytosis - 10.6  Discussed all findings and plans w/ pt. Answered questions.  Hospital day # He[place], MSN, APRN, ANVP-BC, AGPCNP-BC Advanced Practice Stroke Nurse [place] for Schedule & Pager information 05/09/2018 1:48 PM   ATTENDING NOTE: I reviewed above note and agree with the assessment and plan. Pt was seen and examined.   77 year old female with history of A. fib on Eliquis, recent pacer placement, obesity admitted for left-sided weakness, left-sided neglect and facial droop.  CT no acute finding.  CT head and neck showed right M2 occlusion.  Status post mechanical thrombectomy right M2 TICI3 reperfusion and MCA stent.  EF 65 to 70%.  LDL 103 and A1c 4.8.  Postprocedure CT repeat showed mild right SAH.  Not able to do MRI due to recent pacemaker.  Repeat  CT head today showed a stable mild right SAH and possible right CR small infarct.  Patient stroke could be due to A. fib even on Eliquis or hypercoagulable state with advanced malignancy.   Patient neurologically intact on today's rounding.  Hemoglobin 8.0, slightly dropped from yesterday 8.3.  Continue monitoring.  Potassium 3.4, give supplement.  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  CT abdomen pelvis also showed right femoral artery pseudoaneurysm.  Interventional radiology consulted, will do lung biopsy and thrombin injection for pseudoaneurysm today.  Currently on aspirin.  Brilinta has been stopped for biopsy preparation.  Heparin IV overnight currently on hold for IR procedure.  Rosalin Hawking, MD PhD Stroke  Neurology 05/09/2018 5:06 PM   To contact Stroke Continuity provider, please refer to [email or web address] After hours, contact General Neurology

## 2018-05-09 NOTE — Procedures (Signed)
Interventional Radiology Procedure Note  Procedure: CT guided biopsy of RLL pulmonary nodule Complications: No immediate Recommendations: - Bedrest until CXR cleared.  Minimize talking, coughing or otherwise straining.  - Follow up 2 hr CXR pending   Signed,  Criselda Peaches, MD

## 2018-05-09 NOTE — Progress Notes (Signed)
Status post interventional treatment of right CFA pseudoaneurysm with successful thrombosis using ultrasound guided thrombin injection.   RN called regarding possible restart of IV heparin. In this case, heparin would be expected to counteract the effects of the thrombin injection, presenting possible risk of pseudoaneurysm recurrence or bleed at the site. Also with recent blood loss and subarachnoid hemorrhage in the right frontotemporal region following endovascular treatment for her stroke. Overall risks of restarting heparin IV are felt to outweigh benefits of stroke prevention in the setting of her a-fib, over the short term. Stroke team to assess time span for restarting anticoagulation during rounds tomorrow AM. For now, will place SCD on LLE for DVT prophylaxis.   CT head reviewed. Patient discussed with Dr. Erlinda Hong by telephone.   Electronically signed: Dr. Kerney Elbe

## 2018-05-09 NOTE — Progress Notes (Signed)
Pt. Came back from procedure.

## 2018-05-09 NOTE — Progress Notes (Signed)
Referring Physician(s): Dr. Leonie Man  Supervising Physician: Jacqulynn Cadet  Patient Status:  [facility] - In-pt  Chief Complaint: Lung nodules  Subjective: Resting comfortably. Endorses wheezing but denies pain.  Allergies: Patient has no known allergies.  Medications: Prior to Admission medications   Medication Sig Start Date End Date Taking? Authorizing Provider  amLODipine (NORVASC) 5 MG tablet Take 5 mg by mouth daily.   Yes [provider]  apixaban (ELIQUIS) 5 MG TABS tablet Take 5 mg by mouth 2 (two) times daily.   Yes [provider]  diltiazem (CARDIZEM SR) 120 MG 12 hr capsule Take 120 mg by mouth daily.   Yes [provider]  flecainide (TAMBOCOR) 150 MG tablet Take 150 mg by mouth 2 (two) times daily.   Yes [provider]  fluticasone (FLONASE) 50 MCG/ACT nasal spray Place 1 spray into both nostrils daily.   Yes [provider]  guaiFENesin (MUCINEX) 600 MG 12 hr tablet Take 600 mg by mouth every 12 (twelve) hours. **For 14 days**   Yes [provider]  hydrochlorothiazide (HYDRODIURIL) 25 MG tablet Take 25 mg by mouth daily.   Yes [provider]  metoprolol succinate (TOPROL-XL) 25 MG 24 hr tablet Take 25 mg by mouth daily.   Yes [provider]  omeprazole (PRILOSEC) 20 MG capsule Take 20 mg by mouth daily.   Yes [provider]  potassium chloride SA (K-DUR,KLOR-CON) 20 MEQ tablet Take 20 mEq by mouth 2 (two) times daily.   Yes [provider]  pravastatin (PRAVACHOL) 80 MG tablet Take 80 mg by mouth daily.   Yes [provider]  tiZANidine (ZANAFLEX) 2 MG tablet Take 2 mg by mouth 3 (three) times daily.   Yes [provider]  traMADol (ULTRAM) 50 MG tablet Take 50 mg by mouth 2 (two) times daily as needed for moderate pain.   Yes [provider]     Vital Signs: BP (!) 153/62 (BP Location: Left Arm)   Pulse 65   Temp 98.5 F (36.9 C) (Oral)    Resp 20   Ht 5\' 2"  (1.575 m)   Wt 181 lb (82.1 kg)   SpO2 100%   BMI 33.10 kg/m   Physical Exam  Constitutional: She is oriented to person, place, and time. She appears well-developed. No distress.  Cardiovascular: Normal rate, regular rhythm, normal heart sounds and intact distal pulses. Exam reveals no gallop and no friction rub.  No murmur heard. Distal pulses intact  Pulmonary/Chest: Effort normal and breath sounds normal. No respiratory distress. She has no wheezes.  Abdominal: Soft. She exhibits no distension.  Musculoskeletal: Normal range of motion.  Groin with area of palpable abnormality superior to puncture site consistent with pseudoaneurysm identified by CT  Neurological: She is alert and oriented to person, place, and time.  Skin: Skin is warm and dry. She is not diaphoretic.  Psychiatric: She has a normal mood and affect. Her behavior is normal. Judgment and thought content normal.  Nursing note and vitals reviewed.     Imaging: Ct Angio Head W Or Wo Contrast  Result Date: 05/05/2018 CLINICAL DATA:  LEFT-sided weakness. EXAM: CT ANGIOGRAPHY HEAD AND NECK TECHNIQUE: Multidetector CT imaging of the head and neck was performed using the standard protocol during bolus administration of intravenous contrast. Multiplanar CT image reconstructions and MIPs were obtained to evaluate the vascular anatomy. Carotid stenosis measurements (when applicable) are obtained utilizing NASCET criteria, using the distal internal carotid diameter as the  denominator. CONTRAST:  67mL ISOVUE-370 IOPAMIDOL (ISOVUE-370) INJECTION 76% COMPARISON:  Code stroke CT earlier today. FINDINGS: CTA NECK FINDINGS Aortic arch: Standard branching. Imaged portion shows no evidence of aneurysm or dissection. No significant stenosis of the major arch vessel origins. Right carotid system: No evidence of dissection, stenosis (50% or greater) or occlusion. Minor atheromatous change. Left carotid system: No evidence of  dissection, stenosis (50% or greater) or occlusion. Minor atheromatous change. Vertebral arteries: Codominant. No evidence of dissection, stenosis (50% or greater) or occlusion. Skeleton: Spondylosis.  Poor dentition. Other neck: No masses.  Airway patent. Upper chest: Widespread BILATERAL pulmonary nodules, subcentimeter, too numerous to count, slightly larger and more numerous to the LEFT. There is mediastinal adenopathy, incompletely evaluated. These nodules could represent diffuse infection or widespread metastatic disease. Small RIGHT effusion. Aortic atherosclerosis is noted. Dual lead pacemaker enters the heart from LEFT subclavian approach. Review of the MIP images confirms the above findings CTA HEAD FINDINGS Anterior circulation: Minor calcific atheromatous change in the BILATERAL carotid siphons. There is a RIGHT MCA M2 posterior division occlusion, 3 mm from the bifurcation. No M1 stenosis on the RIGHT. Both anterior cerebral arteries are patent, LEFT dominant. No LEFT MCA disease of significance. Posterior circulation: No significant stenosis, proximal occlusion, aneurysm, or vascular malformation. Venous sinuses: As permitted by contrast timing, patent. Anatomic variants: RIGHT fetal PCA. Delayed phase: Not performed. Review of the MIP images confirms the above findings IMPRESSION: No extracranial stenosis or dissection. BILATERAL carotid atheromatous change. 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 Innumerable subcentimeter pulmonary nodules, which could represent infection or metastatic disease. CT chest with contrast could provide additional information. These results were called by telephone at the time of interpretation on 05/05/2018 at 8:40 pm to Dr. Samara Snide , who verbally acknowledged these results. Electronically Signed   By: Staci Righter M.D.   On: 05/05/2018 20:51   Ct Head Wo Contrast  Result Date: 05/09/2018 CLINICAL DATA:  77 y/o  F; stroke follow-up. EXAM: CT  HEAD WITHOUT CONTRAST TECHNIQUE: Contiguous axial images were obtained from the base of the skull through the vertex without intravenous contrast. COMPARISON:  05/06/2018 and 05/07/2011 CT head. FINDINGS: Brain: New subcentimeter focus of hypoattenuation within the right mid corona radiata (series 3, image 17) is compatible with evolving subacute infarction. No large acute vascular territory infarct identified. Stable subcentimeter cortical hemorrhage within the right temporal lobe and small volume of subarachnoid hemorrhage within the right sylvian fissure. No new intracranial hemorrhage identified. Stable background of chronic microvascular ischemic changes and volume loss of the brain. Cavum septum pellucidum. Vascular: Right MCA stent noted. No new hyperdense vessel. Skull: Normal. Negative for fracture or focal lesion. Sinuses/Orbits: Moderate left maxillary sinus mucosal thickening and mild diffuse mucosal thickening of the additional paranasal sinuses. Normal aeration of the mastoid air cells. Orbits are unremarkable. Other: None. IMPRESSION: 1. New subcentimeter focus of hypoattenuation within right mid corona radiata compatible with evolving subacute infarction. 2. Stable subcentimeter cortical hemorrhage within right temporal lobe and small volume of subarachnoid hemorrhage within right Sylvian fissure. 3. No new intracranial hemorrhage identified. 4. Stable chronic microvascular ischemic changes and volume loss of the brain. Electronically Signed   By: Kristine Garbe M.D.   On: 05/09/2018 03:34   Ct Head Wo Contrast  Result Date: 05/06/2018 CLINICAL DATA:  Posterior inferior division RIGHT MCA M2 occlusion. S/P RT common carotid arteriogram followed by endovascular revascularization of RT MCA INF division M2 branch with x 3 passes  with embotrap 5mm x 33 mm retriever device and X1 pass with solitaire Fr 61mm x 40 mm retriever device achieving a TICI 2 b revascularization ,with rescue stent  placement for reocclusion achieving a TICI 3 revascularization. EXAM: CT HEAD WITHOUT CONTRAST TECHNIQUE: Contiguous axial images were obtained from the base of the skull through the vertex without intravenous contrast. COMPARISON:  Multiple prior examinations, 05/05/2018. FINDINGS: Brain: Subarachnoid hemorrhage is observed in the RIGHT sylvian fissure, moderately extensive without visible hemorrhagic infarction or parenchymal hematoma. No subdural collection. Rescue stent is observed in the RIGHT MCA extending across the M1 to M2 segments. Slight hypoattenuation of RIGHT temporal lobe, but normal appearing frontal and parietal lobes. Old LEFT occipital infarct. Hypoattenuation of white matter, likely small vessel disease. Vascular: No visible large vessel occlusion or reocclusion. Skull: Intact Sinuses/Orbits: No acute findings Other: None IMPRESSION: RIGHT sylvian fissure subarachnoid hemorrhage status post endovascular treatment for RIGHT MCA M2 occlusion. This corresponds to [place] bleeding classification 3C. No evidence for hemorrhagic infarction or parenchymal hematoma. Developing hypoattenuation, RIGHT temporal lobe suspected early cytotoxic edema. Electronically Signed   By: Staci Righter M.D.   On: 05/06/2018 13:37   Ct Angio Neck W Or Wo Contrast  Result Date: 05/05/2018 CLINICAL DATA:  LEFT-sided weakness. EXAM: CT ANGIOGRAPHY HEAD AND NECK TECHNIQUE: Multidetector CT imaging of the head and neck was performed using the standard protocol during bolus administration of intravenous contrast. Multiplanar CT image reconstructions and MIPs were obtained to evaluate the vascular anatomy. Carotid stenosis measurements (when applicable) are obtained utilizing NASCET criteria, using the distal internal carotid diameter as the denominator. CONTRAST:  38mL ISOVUE-370 IOPAMIDOL (ISOVUE-370) INJECTION 76% COMPARISON:  Code stroke CT earlier today. FINDINGS: CTA NECK FINDINGS Aortic arch: Standard branching.  Imaged portion shows no evidence of aneurysm or dissection. No significant stenosis of the major arch vessel origins. Right carotid system: No evidence of dissection, stenosis (50% or greater) or occlusion. Minor atheromatous change. Left carotid system: No evidence of dissection, stenosis (50% or greater) or occlusion. Minor atheromatous change. Vertebral arteries: Codominant. No evidence of dissection, stenosis (50% or greater) or occlusion. Skeleton: Spondylosis.  Poor dentition. Other neck: No masses.  Airway patent. Upper chest: Widespread BILATERAL pulmonary nodules, subcentimeter, too numerous to count, slightly larger and more numerous to the LEFT. There is mediastinal adenopathy, incompletely evaluated. These nodules could represent diffuse infection or widespread metastatic disease. Small RIGHT effusion. Aortic atherosclerosis is noted. Dual lead pacemaker enters the heart from LEFT subclavian approach. Review of the MIP images confirms the above findings CTA HEAD FINDINGS Anterior circulation: Minor calcific atheromatous change in the BILATERAL carotid siphons. There is a RIGHT MCA M2 posterior division occlusion, 3 mm from the bifurcation. No M1 stenosis on the RIGHT. Both anterior cerebral arteries are patent, LEFT dominant. No LEFT MCA disease of significance. Posterior circulation: No significant stenosis, proximal occlusion, aneurysm, or vascular malformation. Venous sinuses: As permitted by contrast timing, patent. Anatomic variants: RIGHT fetal PCA. Delayed phase: Not performed. Review of the MIP images confirms the above findings IMPRESSION: No extracranial stenosis or dissection. BILATERAL carotid atheromatous change. 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 Innumerable subcentimeter pulmonary nodules, which could represent infection or metastatic disease. CT chest with contrast could provide additional information. These results were called by telephone at the time of  interpretation on 05/05/2018 at 8:40 pm to Dr. Samara Snide , who verbally acknowledged these results. Electronically Signed   By: Staci Righter M.D.   On: 05/05/2018  20:51   Ct Chest W Contrast  Result Date: 05/06/2018 CLINICAL DATA:  Recent left-sided weakness. CTA of the neck demonstrating pulmonary nodules. No current chest complaints. EXAM: CT CHEST WITH CONTRAST TECHNIQUE: Multidetector CT imaging of the chest was performed during intravenous contrast administration. CONTRAST:  55mL OMNIPAQUE IOHEXOL 300 MG/ML  SOLN COMPARISON:  Neck CTA including 05/05/2018. Chest radiograph of 05/06/2018 FINDINGS: Cardiovascular: Aortic and branch vessel atherosclerosis. Dual lead pacer. Mild cardiomegaly, without pericardial effusion. Pulmonary artery enlargement, outflow tract 3.6 cm No central pulmonary embolism, on this non-dedicated study. Mediastinum/Nodes: No axillary adenopathy. Right paratracheal node measures 1.4 cm on image 39/3. Right hilar adenopathy at 2.2 cm on image 56/3. Left infrahilar node of 1.4 cm on image 64/3. Small hiatal hernia. Lungs/Pleura: Trace right pleural fluid. Innumerable pulmonary nodules throughout. Index right lower lobe pulmonary nodule measures 1.7 x 1.6 cm on image 85/7. Index left lower lobe nodule measures 1.9 x 1.6 cm on image 91/7. A pleural-based right lower lobe lung mass measures 3.1 x 2.4 cm on image 79/7. Upper Abdomen: Small volume perihepatic and perisplenic ascites. Normal imaged portions of the liver, spleen, kidneys. There is edema in the anterior pararenal space including on image 118/3. Soft tissue fullness posterior to the pancreas and surrounding the celiac including on image 126/3. This is incompletely imaged. Mild right greater than left adrenal nodularity, nonspecific. Abdominal aortic and branch vessel atherosclerosis. Adjacent gastrohepatic ligament adenopathy including at 1.7 cm on image 117/3. Musculoskeletal: Mild thoracic spondylosis. IMPRESSION: 1.  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 2. Abnormal appearance of the abdominal retroperitoneum with soft tissue surrounding the proximal celiac. Although this appearance typically is seen with pancreatic carcinoma, given absence of symptoms or abdominal complaints, lymphoma or upper abdominal metastasis from an unknown primary are considerations. 3. Consider PET and/or dedicated abdominopelvic contrast-enhanced CT. 4. Small hiatal hernia. 5. Small right pleural effusion. 6. Coronary artery atherosclerosis. Aortic Atherosclerosis (ICD10-I70.0). 7. Pulmonary artery enlargement suggests pulmonary arterial hypertension. 8. Small volume abdominal ascites. 9. Peripancreatic edema for which superimposed pancreatitis cannot be excluded. Electronically Signed   By: Abigail Miyamoto M.D.   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 Sagittal and coronal MPR images reconstructed from axial data set. CONTRAST:  171mL OMNIPAQUE IOHEXOL 300 MG/ML SOLN IV. Dilute oral contrast. COMPARISON:  CT chest 05/06/2018 FINDINGS: Lower chest: Small RIGHT pleural effusion. Numerous BILATERAL soft tissue nodules at the lung bases consistent with pulmonary metastases up to 2.1 cm diameter. Hepatobiliary: Question minimal pericholecystic infiltration/edema. Minimal biliary dilatation LEFT lobe liver. Mild periportal edema. No definite hepatic mass lesion Pancreas: Distal pancreatic body and pancreatic tail normal appearance. Cystic lesion at the pancreatic body/head 24 x 17 x 19 mm. Diffuse infiltration of fat planes peripancreatic, at celiac axis, para-aortic, extending  to the posterior aspect of the pylorus and to the porta hepatis. Effacement of fat plane between the pancreatic head and duodenum. Attenuation of the LEFT renal vein. Multiple small peripancreatic and LEFT para-aortic nodes largest an 18 mm LEFT para-aortic node image 20. Spleen: Normal appearance Adrenals/Urinary Tract: 12 x 9 mm LEFT adrenal nodule. RIGHT adrenal gland unremarkable. Small cyst at inferior pole RIGHT kidney 3.1  x 3.3 cm. Kidneys, ureters, and bladder otherwise unremarkable. Stomach/Bowel: Appendix not identified. Scattered colonic diverticulosis. Small hiatal hernia. Stomach and small bowel loops otherwise normal in appearance. Vascular/Lymphatic: Atherosclerotic calcifications aorta without aneurysm. Scattered pelvic phleboliths. Other than above peripancreatic and para-aortic adenopathy in the upper abdomen, no additional abdominal or pelvic adenopathy seen. Multiloculated enhancing beaded appearing collection is seen in the RIGHT inguinal region question pseudoaneurysm extending anterior from the bifurcation of the RIGHT common femoral artery, collection measuring approximately 2.4 x 4.5 x 5.2 cm in greatest size. Reproductive: Uterus surgically absent. Nonvisualization of ovaries. Other: No free air free fluid. Tiny umbilical hernia. Stranding of retroperitoneal tissue planes into the pelvis bilaterally. Musculoskeletal: No acute osseous findings. Degenerative disc disease changes L5-S1. IMPRESSION: Diffuse peripancreatic edema with infiltration of the celiac axis and para-aortic regions associated with peripancreatic and LEFT para-aortic adenopathy, question neoplastic from a poorly defined pancreatic head mass versus infiltrative celiac tumor or inflammatory from pancreatitis. Minimal intrahepatic biliary dilatation LEFT lobe liver of uncertain etiology. Cystic collection at the pancreatic head 24 x 17 x 19 mm could represent a cystic pancreatic neoplasm, pancreatic pseudocyst, or choledochal  cyst. Extensive bibasilar pulmonary metastases and small RIGHT pleural effusion. Tiny nonspecific LEFT adrenal nodule 12 x 9 mm. 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 Finding of a groin pseudoaneurysm was called to Dr. Leonel Ramsay on 05/08/2018 at hrs. Electronically Signed   By: Lavonia Dana M.D.   On: 05/08/2018 20:21   Ir Intra Cran Stent  Result Date: 05/08/2018 INDICATION: Left-sided facial droop, left-sided weakness, confusion. Left-sided neglect. 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 EXAM: 1. EMERGENT LARGE VESSEL OCCLUSION THROMBOLYSIS (anterior CIRCULATION) COMPARISON:  CT angiogram of the head and neck of 05/05/2018. MEDICATIONS: Ancef 2 g IV antibiotic was administered within 1 hour of the procedure. ANESTHESIA/SEDATION: General anesthesia CONTRAST:  Isovue 300 approximately 120 mL FLUOROSCOPY TIME:  Fluoroscopy Time: 73 minutes 24 seconds (2856 mGy). COMPLICATIONS: None immediate. TECHNIQUE: Following a full explanation of the procedure along with the potential associated complications, an informed witnessed consent was obtained from the patient's daughter. The risks of intracranial hemorrhage of 10%, worsening neurological deficit, ventilator dependency, death and inability to revascularize were all reviewed in detail with the patient's daughter. The patient was then put under general anesthesia by the Department of Anesthesiology at [facility]. The right groin was prepped and draped in the usual sterile fashion. Thereafter using modified Seldinger technique, transfemoral access into the right common femoral artery was obtained without difficulty. Over a 0.035 inch guidewire a 5 French Pinnacle sheath was inserted. Through this, and also over a 0.035 inch guidewire a 5 [place] JB 1 catheter was advanced to the aortic arch region and selectively  positioned in the innominate artery and the right common carotid artery. FINDINGS: The innominate artery angiogram demonstrates wide patency of the origin of the right subclavian artery and the right common carotid artery. Moderate tortuosity is seen of the proximal right common carotid artery. The right vertebral artery origin demonstrates wide patency with flow demonstrated angiographically to the cranial skull base to the level of the right posterior-inferior cerebellar artery. The right common carotid arteriogram demonstrates the right external carotid artery and its major branches to be widely patent. The right internal carotid artery at the bulb to the cranial skull base demonstrates wide patency. The petrous, cavernous and supraclinoid segments demonstrate normal  opacification. The right posterior communicating artery is seen opacifying the right posterior cerebral distribution. The right middle cerebral artery and the right anterior cerebral artery opacify into the capillary and venous phases. Partial cross-filling via the anterior communicating artery of the left anterior cerebral artery A2 segment is seen. 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 PROCEDURE: ENDOVASCULAR REVASCULARIZATION OF OCCLUDED RIGHT MIDDLE CEREBRAL ARTERY INFERIOR DIVISION PROXIMALLY WITH 3 PASSES WITH THE EMBOTRAP 5 MM X 33 MM RETRIEVAL DEVICE, AND 1 PASS WITH SOLITAIRE FR 4 MM X 40 MM X RETRIEVAL DEVICE ACHIEVING A TICI 2B REVASCULARIZATION, WITH RESCUE STENTING OF REOCCLUSION OF THE INFERIOR DIVISION WITH PLACEMENT OF A 4 MM X 30 MM ENTERPRISE VASCULAR RECONSTRUCT DEVICE ACHIEVING A TICI 3 REVASCULARIZATION. The diagnostic JB 1 catheter in the right common carotid artery was exchanged over a 0.035 inch 300 cm Rosen exchange guidewire for a 55 cm 8 French Brite tip neurovascular sheath using biplane roadmap technique and  constant fluoroscopic guidance. Good aspiration obtained from the hub of the Brite tip neurovascular sheath. This was then connected to continuous heparinized saline infusion. Over the 300 cm Rosen exchange guidewire, an 8 [place] 85 cm FlowGate balloon guide catheter which had been prepped with 50% contrast and 50% heparinized saline infusion was advanced and positioned in the proximal 1/3 of the right internal carotid artery. The guidewire was removed. Good aspiration obtained from the hub of the [facility] guide catheter. A gentle control arteriogram demonstrated no evidence of spasms, dissections or of intraluminal filling defects. Over a 0.014 inch Softip Synchro micro guidewire, the combination of a 115 cm 5 [place] Navien guide catheter inside of which was a 0.021 Trevo ProVue microcatheter was advanced and positioned just distal to the tip of the FlowGate guide catheter in the right internal carotid artery. Thereafter,with the micro guidewire leading with a J-tip configuration to avoid dissections or inducing spasm, the combination was navigated to the supraclinoid right ICA. The micro guidewire was then manipulated with the torque device and advanced into the right M1 segment followed by the microcatheter. The wire was then guided without difficulty through the occluded right MCA inferior division to the M2 M3 region followed by the microcatheter. The guidewire was removed. Good aspiration obtained from the hub of the microcatheter. A gentle control arteriogram through the hub of the microcatheter demonstrated safe position of tip of the microcatheter. This was then connected to continuous heparinized saline infusion. The 5 [place] Navien guide catheter was advanced to the supraclinoid right ICA distal to the origin of the right posterior communicating artery. A 5 mm x 33 mm Embotrap retrieval device was then advanced to the distal end of the tip of the microcatheter. The O ring on the delivery microcatheter was  then loosened. With slight forward gentle traction with the right hand on the delivery micro guidewire, with the left hand, the delivery microcatheter was retrieved unsheathing the distal and the proximal portion of the retrieval device at the defined positions. A gentle control arteriogram performed through the 5 [place] Navien guide catheter in the right internal carotid artery demonstrated no change in the occluded vessel. With proximal flow arrest established by inflating the balloon at the tip of the [facility] guide catheter in the right internal carotid artery the combination of the retrieval device, the microcatheter and the Navien guide catheter was retrieved as constant aspiration was applied at the hub of the [place] [facility] guide catheter  with a 60 mL syringe, and at the hub of the 5 [place] Navien guide catheter using a Penumbra aspiration vacuum device. Aspiration was continued until the combination was retrieved and removed. Aspiration was also continued as the balloon was deflated in the right internal carotid artery. Free back bleed of blood was noted at the hub of the [facility] guide catheter. The retrieval device contained a few small fibery clots. A gentle control arteriogram performed through the [facility] guide catheter demonstrated revascularization of the occluded inferior division of the right middle cerebral artery with a TICI 2 b revascularization. However, there continued to be a large non flow-limiting clot demonstrated at the origins of the dominant inferior division, and also the superior divisions. This prompted a second pass being made again using the above combination of the Trevo ProVue microcatheter inside of a 5 [place] Navien guide catheter advanced over a 0.014 inch Softip Synchro micro guidewire to the inferior division of the left middle cerebral artery M1 M2 region. Again after verifying safe position of tip of the microcatheter as above, and connected to continuous heparinized saline  infusion, 5 mm x 33 mm Embotrap retrieval device was advanced to the distal end of the microcatheter. Again after having verified the proximal and the landing zones of the retrieval device, this was then deployed again as mentioned above. A gentle control arteriogram performed through the Navien guide catheter in the supraclinoid right ICA demonstrated continued TICI 2b revascularization. With proximal flow arrest in the right internal carotid artery by inflating the balloon of the FlowGate guide catheter, the combination of the retrieval device, the microcatheter, and the 5 [place] Navien guide catheter was then retrieved as constant aspiration was applied with a 60 mL syringe at the hub of the [facility] guide catheter, and with a Penumbra vacuum aspiration device at the hub of the Navien guide catheter. After removal of the combination, the balloon was deflated as aspiration was continued at the hub of the 8 [place] FlowGate guide catheter. No clots were seen in the aspirate or in the interstices of the retrieval device. A control arteriogram performed through the 8 [place] FlowGate guide catheter in the right internal carotid artery continued to demonstrate the clot at the bifurcation of the inferior and the superior divisions. This prompted yet a third attempt again using the above combination as described after deployment of the 5 mm x 33 mm Embotrap retrieval device, and the M1 M2 region of the inferior division. With proximal flow arrest the combination of the retrieval device again was retrieved with the microcatheter and the 5 [place] Navien guide catheter with constant aspiration being applied at the hub of the 5 [place] Navien guide catheter and the [facility] guide catheter. Again the balloon was deflated as aspiration was continued at the hub of the [facility] guide catheter. With back bleed established, a gentle control arteriogram performed through the 8 [place] FlowGate guide catheter in the right internal  carotid artery continued to demonstrate a filling defect now with worsening slow flow in the inferior and superior divisions of the right middle cerebral artery. At this time a combination of the Trevo ProVue microcatheter inside a 5 French 115 cm Catalyst guide catheter was advanced over a 0.014 inch Softip Synchro micro guidewire to the supraclinoid right ICA. Again with the micro guidewire leading, access was obtained into the distal M1 M2 region of the inferior division followed by the microcatheter. The guidewire was removed. Good aspiration obtained from the hub of the microcatheter.  This was then connected to continuous heparinized saline infusion. A 4 mm x 40 mm X Solitaire FR retrieval device was then advanced to the distal end of the microcatheter. The proximal and the distal landing zones were then defined. The O ring on the delivery microcatheter was loosened. With slight forward gentle pressure with the right hand on the delivery micro guidewire with the left hand the delivery microcatheter was retrieved unsheathing the distal and the proximal portion of the retrieval device. A control arteriogram performed through the 5 [place] Catalyst guide catheter in the right middle cerebellar proximally demonstrated a TICI 2b revascularization. The Catalyst guide catheter was advanced to just proximal to the retrieval device. Thereafter, with constant aspiration being applied with a 60 mL syringe at the hub of the [facility] guide catheter, and a Penumbra aspiration device at the hub of the 5 [place] Catalyst guide catheter with proximal flow arrest, the combination was retrieved and removed. Aspiration was continued as proximal flow arrest was reversed. No clots were seen in the aspirate or the retrieval device. A gentle control arteriogram performed through the 8 [place] FlowGate guide catheter in the right internal carotid artery demonstrated continued filling defect at the junction of the superior and inferior  divisions. It was decided to place a rescue stent at the origin of the inferior division of the right middle cerebral artery. A combination of the Trevo ProVue microcatheter inside of a Catalyst guide catheter was then advanced to the supraclinoid right ICA over a 0.014 inch Softip Synchro micro guidewire. With the micro guidewire leading with a J-tip configuration, access was obtained through the occluded inferior division into the M1 M2 region followed by the microcatheter. The guidewire was removed. Good aspiration obtained from the hub of the microcatheter. A gentle control arteriogram demonstrated safe positioning of the tip of the microcatheter with antegrade flow noted. This was then connected to continuous heparinized saline infusion. Measurements were then performed of the length of the stent to be utilized. It was decided to place a 4 mm x 30 mm Enterprise vascular reconstruct device. This was then advanced in a coaxial manner with constant heparinized saline infusion to the distal end of the microcatheter. The landing zone was then aligned at the defined sites of placement of the distal and proximal portion of the stent. Thereafter the O ring on the delivery microcatheter was loosened. With slight forward gentle traction with the right hand on the delivery microcatheter, with the left hand the distal and then the proximal stent were then delivered and deployed without difficulty. The delivery micro guidewire was then removed. Microcatheter was positioned just proximal to the proximal portion of the stent. A control arteriogram performed through the 5 [place] Catalyst guide catheter in the supraclinoid right ICA demonstrated a revascularization of the right middle cerebral distribution. Patency of the right posterior communicating artery and the right anterior cerebral artery is noted. Prior to the position of the stent, the patient was loaded with 180 mg of Brilinta, and 81 mg of aspirin through an  orogastric tube. Control arteriograms were then performed at 15, 30 and 40 minutes post stent placement. It continued to demonstrate a TICI 3 revascularization. Mild spasm was treated with 2 aliquots of 25 mcg nitroglycerin with positive effect. Throughout the procedure, the patient's blood pressure and neurological status remained stable, A final control arteriogram performed through the 8 [place] FlowGate guide catheter in the right internal carotid artery after removal of the Catalyst guide catheter continued to demonstrate  a TICI 3 revascularization with patency of the posterior communicating artery on the right and the right anterior cerebral artery. The FlowGate guide catheter was then retrieved into the abdominal aorta followed by removal of this and the Brite tip neurovascular sheath over a 0.035 inch J-tip guidewire with placement of an 8 French Pinnacle sheath. This was left in situ in view of the patient being on Eliquis. The right groin appeared soft without evidence of hematoma. Distal pulses were palpable in the dorsalis pedis, and posterior tibial regions bilaterally. A Dyna CT of the brain demonstrated a small amount of right peri-insular subarachnoid hemorrhage mixed with contrast. No mass-effect or midline shift was noted. The patient was then extubated. Upon recovery the patient was able to obey simple commands, beginning to lift her left arm and bend her left knee. Her pupils were equal. She was then transported to the neuro ICU to continue with further ischemic stroke management. IMPRESSION: Revascularization of occluded right middle cerebral artery inferior division with 3 passes with the Embotrap 5 mm x 33 mm retrieval device, and 1 pass with the Solitaire FR 4 mm x 40 mm X device achieving a TICI 2b revascularization followed by placement of a rescue stent for reocclusion achieving a TICI 3 revascularization. PLAN: Patient transferred to the neuro ICU for further ischemic stroke management.  Patient will be followed in the clinic 4 weeks post discharge. Electronically Signed   By: Luanne Bras M.D.   On: 05/07/2018 13:20   [place]  Result Date: 05/08/2018 INDICATION: Left-sided facial droop, left-sided weakness, confusion. Left-sided neglect. 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 EXAM: 1. EMERGENT LARGE VESSEL OCCLUSION THROMBOLYSIS (anterior CIRCULATION) COMPARISON:  CT angiogram of the head and neck of 05/05/2018. MEDICATIONS: Ancef 2 g IV antibiotic was administered within 1 hour of the procedure. ANESTHESIA/SEDATION: General anesthesia CONTRAST:  Isovue 300 approximately 120 mL FLUOROSCOPY TIME:  Fluoroscopy Time: 73 minutes 24 seconds (2856 mGy). COMPLICATIONS: None immediate. TECHNIQUE: Following a full explanation of the procedure along with the potential associated complications, an informed witnessed consent was obtained from the patient's daughter. The risks of intracranial hemorrhage of 10%, worsening neurological deficit, ventilator dependency, death and inability to revascularize were all reviewed in detail with the patient's daughter. The patient was then put under general anesthesia by the Department of Anesthesiology at [facility]. The right groin was prepped and draped in the usual sterile fashion. Thereafter using modified Seldinger technique, transfemoral access into the right common femoral artery was obtained without difficulty. Over a 0.035 inch guidewire a 5 French Pinnacle sheath was inserted. Through this, and also over a 0.035 inch guidewire a 5 [place] JB 1 catheter was advanced to the aortic arch region and selectively positioned in the innominate artery and the right common carotid artery. FINDINGS: The innominate artery angiogram demonstrates wide patency of the origin of the right subclavian artery and the right common carotid artery. Moderate tortuosity is seen of the proximal  right common carotid artery. The right vertebral artery origin demonstrates wide patency with flow demonstrated angiographically to the cranial skull base to the level of the right posterior-inferior cerebellar artery. The right common carotid arteriogram demonstrates the right external carotid artery and its major branches to be widely patent. The right internal carotid artery at the bulb to the cranial skull base demonstrates wide patency. The petrous, cavernous and supraclinoid segments demonstrate normal opacification. The right posterior communicating artery is  seen opacifying the right posterior cerebral distribution. The right middle cerebral artery and the right anterior cerebral artery opacify into the capillary and venous phases. Partial cross-filling via the anterior communicating artery of the left anterior cerebral artery A2 segment is seen. 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 PROCEDURE: ENDOVASCULAR REVASCULARIZATION OF OCCLUDED RIGHT MIDDLE CEREBRAL ARTERY INFERIOR DIVISION PROXIMALLY WITH 3 PASSES WITH THE EMBOTRAP 5 MM X 33 MM RETRIEVAL DEVICE, AND 1 PASS WITH SOLITAIRE FR 4 MM X 40 MM X RETRIEVAL DEVICE ACHIEVING A TICI 2B REVASCULARIZATION, WITH RESCUE STENTING OF REOCCLUSION OF THE INFERIOR DIVISION WITH PLACEMENT OF A 4 MM X 30 MM ENTERPRISE VASCULAR RECONSTRUCT DEVICE ACHIEVING A TICI 3 REVASCULARIZATION. The diagnostic JB 1 catheter in the right common carotid artery was exchanged over a 0.035 inch 300 cm Rosen exchange guidewire for a 55 cm 8 [place] Brite tip neurovascular sheath using biplane roadmap technique and constant fluoroscopic guidance. Good aspiration obtained from the hub of the Brite tip neurovascular sheath. This was then connected to continuous heparinized saline infusion. Over the 300 cm Rosen exchange guidewire, an 8 [place] 85 cm FlowGate balloon guide catheter  which had been prepped with 50% contrast and 50% heparinized saline infusion was advanced and positioned in the proximal 1/3 of the right internal carotid artery. The guidewire was removed. Good aspiration obtained from the hub of the [facility] guide catheter. A gentle control arteriogram demonstrated no evidence of spasms, dissections or of intraluminal filling defects. Over a 0.014 inch Softip Synchro micro guidewire, the combination of a 115 cm 5 [place] Navien guide catheter inside of which was a 0.021 Trevo ProVue microcatheter was advanced and positioned just distal to the tip of the FlowGate guide catheter in the right internal carotid artery. Thereafter,with the micro guidewire leading with a J-tip configuration to avoid dissections or inducing spasm, the combination was navigated to the supraclinoid right ICA. The micro guidewire was then manipulated with the torque device and advanced into the right M1 segment followed by the microcatheter. The wire was then guided without difficulty through the occluded right MCA inferior division to the M2 M3 region followed by the microcatheter. The guidewire was removed. Good aspiration obtained from the hub of the microcatheter. A gentle control arteriogram through the hub of the microcatheter demonstrated safe position of tip of the microcatheter. This was then connected to continuous heparinized saline infusion. The 5 [place] Navien guide catheter was advanced to the supraclinoid right ICA distal to the origin of the right posterior communicating artery. A 5 mm x 33 mm Embotrap retrieval device was then advanced to the distal end of the tip of the microcatheter. The O ring on the delivery microcatheter was then loosened. With slight forward gentle traction with the right hand on the delivery micro guidewire, with the left hand, the delivery microcatheter was retrieved unsheathing the distal and the proximal portion of the retrieval device at the defined positions. A  gentle control arteriogram performed through the 5 [place] Navien guide catheter in the right internal carotid artery demonstrated no change in the occluded vessel. With proximal flow arrest established by inflating the balloon at the tip of the [facility] guide catheter in the right internal carotid artery the combination of the retrieval device, the microcatheter and the Navien guide catheter was retrieved as constant aspiration was applied at the hub of the [facility] guide catheter with a 60 mL syringe, and at  the hub of the 5 [place] Navien guide catheter using a Penumbra aspiration vacuum device. Aspiration was continued until the combination was retrieved and removed. Aspiration was also continued as the balloon was deflated in the right internal carotid artery. Free back bleed of blood was noted at the hub of the [facility] guide catheter. The retrieval device contained a few small fibery clots. A gentle control arteriogram performed through the [facility] guide catheter demonstrated revascularization of the occluded inferior division of the right middle cerebral artery with a TICI 2 b revascularization. However, there continued to be a large non flow-limiting clot demonstrated at the origins of the dominant inferior division, and also the superior divisions. This prompted a second pass being made again using the above combination of the Trevo ProVue microcatheter inside of a 5 [place] Navien guide catheter advanced over a 0.014 inch Softip Synchro micro guidewire to the inferior division of the left middle cerebral artery M1 M2 region. Again after verifying safe position of tip of the microcatheter as above, and connected to continuous heparinized saline infusion, 5 mm x 33 mm Embotrap retrieval device was advanced to the distal end of the microcatheter. Again after having verified the proximal and the landing zones of the retrieval device, this was then deployed again as mentioned above. A gentle control arteriogram  performed through the Navien guide catheter in the supraclinoid right ICA demonstrated continued TICI 2b revascularization. With proximal flow arrest in the right internal carotid artery by inflating the balloon of the FlowGate guide catheter, the combination of the retrieval device, the microcatheter, and the 5 [place] Navien guide catheter was then retrieved as constant aspiration was applied with a 60 mL syringe at the hub of the [facility] guide catheter, and with a Penumbra vacuum aspiration device at the hub of the Navien guide catheter. After removal of the combination, the balloon was deflated as aspiration was continued at the hub of the 8 [place] FlowGate guide catheter. No clots were seen in the aspirate or in the interstices of the retrieval device. A control arteriogram performed through the 8 [place] FlowGate guide catheter in the right internal carotid artery continued to demonstrate the clot at the bifurcation of the inferior and the superior divisions. This prompted yet a third attempt again using the above combination as described after deployment of the 5 mm x 33 mm Embotrap retrieval device, and the M1 M2 region of the inferior division. With proximal flow arrest the combination of the retrieval device again was retrieved with the microcatheter and the 5 [place] Navien guide catheter with constant aspiration being applied at the hub of the 5 [place] Navien guide catheter and the [facility] guide catheter. Again the balloon was deflated as aspiration was continued at the hub of the [facility] guide catheter. With back bleed established, a gentle control arteriogram performed through the 8 [place] FlowGate guide catheter in the right internal carotid artery continued to demonstrate a filling defect now with worsening slow flow in the inferior and superior divisions of the right middle cerebral artery. At this time a combination of the Trevo ProVue microcatheter inside a 5 [place] 115 cm Catalyst guide catheter  was advanced over a 0.014 inch Softip Synchro micro guidewire to the supraclinoid right ICA. Again with the micro guidewire leading, access was obtained into the distal M1 M2 region of the inferior division followed by the microcatheter. The guidewire was removed. Good aspiration obtained from the hub of the microcatheter. This was then connected to continuous heparinized  saline infusion. A 4 mm x 40 mm X Solitaire FR retrieval device was then advanced to the distal end of the microcatheter. The proximal and the distal landing zones were then defined. The O ring on the delivery microcatheter was loosened. With slight forward gentle pressure with the right hand on the delivery micro guidewire with the left hand the delivery microcatheter was retrieved unsheathing the distal and the proximal portion of the retrieval device. A control arteriogram performed through the 5 [place] Catalyst guide catheter in the right middle cerebellar proximally demonstrated a TICI 2b revascularization. The Catalyst guide catheter was advanced to just proximal to the retrieval device. Thereafter, with constant aspiration being applied with a 60 mL syringe at the hub of the [facility] guide catheter, and a Penumbra aspiration device at the hub of the 5 [place] Catalyst guide catheter with proximal flow arrest, the combination was retrieved and removed. Aspiration was continued as proximal flow arrest was reversed. No clots were seen in the aspirate or the retrieval device. A gentle control arteriogram performed through the 8 [place] FlowGate guide catheter in the right internal carotid artery demonstrated continued filling defect at the junction of the superior and inferior divisions. It was decided to place a rescue stent at the origin of the inferior division of the right middle cerebral artery. A combination of the Trevo ProVue microcatheter inside of a Catalyst guide catheter was then advanced to the supraclinoid right ICA over a 0.014 inch  Softip Synchro micro guidewire. With the micro guidewire leading with a J-tip configuration, access was obtained through the occluded inferior division into the M1 M2 region followed by the microcatheter. The guidewire was removed. Good aspiration obtained from the hub of the microcatheter. A gentle control arteriogram demonstrated safe positioning of the tip of the microcatheter with antegrade flow noted. This was then connected to continuous heparinized saline infusion. Measurements were then performed of the length of the stent to be utilized. It was decided to place a 4 mm x 30 mm Enterprise vascular reconstruct device. This was then advanced in a coaxial manner with constant heparinized saline infusion to the distal end of the microcatheter. The landing zone was then aligned at the defined sites of placement of the distal and proximal portion of the stent. Thereafter the O ring on the delivery microcatheter was loosened. With slight forward gentle traction with the right hand on the delivery microcatheter, with the left hand the distal and then the proximal stent were then delivered and deployed without difficulty. The delivery micro guidewire was then removed. Microcatheter was positioned just proximal to the proximal portion of the stent. A control arteriogram performed through the 5 [place] Catalyst guide catheter in the supraclinoid right ICA demonstrated a revascularization of the right middle cerebral distribution. Patency of the right posterior communicating artery and the right anterior cerebral artery is noted. Prior to the position of the stent, the patient was loaded with 180 mg of Brilinta, and 81 mg of aspirin through an orogastric tube. Control arteriograms were then performed at 15, 30 and 40 minutes post stent placement. It continued to demonstrate a TICI 3 revascularization. Mild spasm was treated with 2 aliquots of 25 mcg nitroglycerin with positive effect. Throughout the procedure, the  patient's blood pressure and neurological status remained stable, A final control arteriogram performed through the 8 [place] FlowGate guide catheter in the right internal carotid artery after removal of the Catalyst guide catheter continued to demonstrate a TICI 3 revascularization with patency of  the posterior communicating artery on the right and the right anterior cerebral artery. The FlowGate guide catheter was then retrieved into the abdominal aorta followed by removal of this and the Brite tip neurovascular sheath over a 0.035 inch J-tip guidewire with placement of an 8 [place] Pinnacle sheath. This was left in situ in view of the patient being on Eliquis. The right groin appeared soft without evidence of hematoma. Distal pulses were palpable in the dorsalis pedis, and posterior tibial regions bilaterally. A Dyna CT of the brain demonstrated a small amount of right peri-insular subarachnoid hemorrhage mixed with contrast. No mass-effect or midline shift was noted. The patient was then extubated. Upon recovery the patient was able to obey simple commands, beginning to lift her left arm and bend her left knee. Her pupils were equal. She was then transported to the neuro ICU to continue with further ischemic stroke management. IMPRESSION: Revascularization of occluded right middle cerebral artery inferior division with 3 passes with the Embotrap 5 mm x 33 mm retrieval device, and 1 pass with the Solitaire FR 4 mm x 40 mm X device achieving a TICI 2b revascularization followed by placement of a rescue stent for reocclusion achieving a TICI 3 revascularization. PLAN: Patient transferred to the neuro ICU for further ischemic stroke management. Patient will be followed in the clinic 4 weeks post discharge. Electronically Signed   By: Luanne Bras M.D.   On: 05/07/2018 13:20   Dg Chest Port 1 View  Result Date: 05/06/2018 CLINICAL DATA:  Recent stroke EXAM: PORTABLE CHEST 1 VIEW COMPARISON:  None. FINDINGS:  Cardiac shadow is at the upper limits of normal in size. Pacing device is noted. Diffuse vascular congestion and patchy densities are noted bilaterally likely related to pulmonary edema. No focal confluent infiltrate is seen. No sizable effusion is noted. IMPRESSION: Diffuse pulmonary edema. Electronically Signed   By: Inez Catalina M.D.   On: 05/06/2018 10:31   Ir Percutaneous Art Thrombectomy/infusion Intracranial Inc Diag Angio  Result Date: 05/08/2018 INDICATION: Left-sided facial droop, left-sided weakness, confusion. Left-sided neglect. 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 EXAM: 1. EMERGENT LARGE VESSEL OCCLUSION THROMBOLYSIS (anterior CIRCULATION) COMPARISON:  CT angiogram of the head and neck of 05/05/2018. MEDICATIONS: Ancef 2 g IV antibiotic was administered within 1 hour of the procedure. ANESTHESIA/SEDATION: General anesthesia CONTRAST:  Isovue 300 approximately 120 mL FLUOROSCOPY TIME:  Fluoroscopy Time: 73 minutes 24 seconds (2856 mGy). COMPLICATIONS: None immediate. TECHNIQUE: Following a full explanation of the procedure along with the potential associated complications, an informed witnessed consent was obtained from the patient's daughter. The risks of intracranial hemorrhage of 10%, worsening neurological deficit, ventilator dependency, death and inability to revascularize were all reviewed in detail with the patient's daughter. The patient was then put under general anesthesia by the Department of Anesthesiology at [facility]. The right groin was prepped and draped in the usual sterile fashion. Thereafter using modified Seldinger technique, transfemoral access into the right common femoral artery was obtained without difficulty. Over a 0.035 inch guidewire a 5 French Pinnacle sheath was inserted. Through this, and also over a 0.035 inch guidewire a 5 [place] JB 1 catheter was advanced to the aortic arch region and  selectively positioned in the innominate artery and the right common carotid artery. FINDINGS: The innominate artery angiogram demonstrates wide patency of the origin of the right subclavian artery and the right common carotid artery. Moderate tortuosity is seen of the proximal  right common carotid artery. The right vertebral artery origin demonstrates wide patency with flow demonstrated angiographically to the cranial skull base to the level of the right posterior-inferior cerebellar artery. The right common carotid arteriogram demonstrates the right external carotid artery and its major branches to be widely patent. The right internal carotid artery at the bulb to the cranial skull base demonstrates wide patency. The petrous, cavernous and supraclinoid segments demonstrate normal opacification. The right posterior communicating artery is seen opacifying the right posterior cerebral distribution. The right middle cerebral artery and the right anterior cerebral artery opacify into the capillary and venous phases. Partial cross-filling via the anterior communicating artery of the left anterior cerebral artery A2 segment is seen. 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 PROCEDURE: ENDOVASCULAR REVASCULARIZATION OF OCCLUDED RIGHT MIDDLE CEREBRAL ARTERY INFERIOR DIVISION PROXIMALLY WITH 3 PASSES WITH THE EMBOTRAP 5 MM X 33 MM RETRIEVAL DEVICE, AND 1 PASS WITH SOLITAIRE FR 4 MM X 40 MM X RETRIEVAL DEVICE ACHIEVING A TICI 2B REVASCULARIZATION, WITH RESCUE STENTING OF REOCCLUSION OF THE INFERIOR DIVISION WITH PLACEMENT OF A 4 MM X 30 MM ENTERPRISE VASCULAR RECONSTRUCT DEVICE ACHIEVING A TICI 3 REVASCULARIZATION. The diagnostic JB 1 catheter in the right common carotid artery was exchanged over a 0.035 inch 300 cm Rosen exchange guidewire for a 55 cm 8 French Brite tip neurovascular sheath using biplane roadmap  technique and constant fluoroscopic guidance. Good aspiration obtained from the hub of the Brite tip neurovascular sheath. This was then connected to continuous heparinized saline infusion. Over the 300 cm Rosen exchange guidewire, an 8 [place] 85 cm FlowGate balloon guide catheter which had been prepped with 50% contrast and 50% heparinized saline infusion was advanced and positioned in the proximal 1/3 of the right internal carotid artery. The guidewire was removed. Good aspiration obtained from the hub of the [facility] guide catheter. A gentle control arteriogram demonstrated no evidence of spasms, dissections or of intraluminal filling defects. Over a 0.014 inch Softip Synchro micro guidewire, the combination of a 115 cm 5 [place] Navien guide catheter inside of which was a 0.021 Trevo ProVue microcatheter was advanced and positioned just distal to the tip of the FlowGate guide catheter in the right internal carotid artery. Thereafter,with the micro guidewire leading with a J-tip configuration to avoid dissections or inducing spasm, the combination was navigated to the supraclinoid right ICA. The micro guidewire was then manipulated with the torque device and advanced into the right M1 segment followed by the microcatheter. The wire was then guided without difficulty through the occluded right MCA inferior division to the M2 M3 region followed by the microcatheter. The guidewire was removed. Good aspiration obtained from the hub of the microcatheter. A gentle control arteriogram through the hub of the microcatheter demonstrated safe position of tip of the microcatheter. This was then connected to continuous heparinized saline infusion. The 5 [place] Navien guide catheter was advanced to the supraclinoid right ICA distal to the origin of the right posterior communicating artery. A 5 mm x 33 mm Embotrap retrieval device was then advanced to the distal end of the tip of the microcatheter. The O ring on the delivery  microcatheter was then loosened. With slight forward gentle traction with the right hand on the delivery micro guidewire, with the left hand, the delivery microcatheter was retrieved unsheathing the distal and the proximal portion of the retrieval device at the defined positions. A gentle control  arteriogram performed through the 5 [place] Navien guide catheter in the right internal carotid artery demonstrated no change in the occluded vessel. With proximal flow arrest established by inflating the balloon at the tip of the [facility] guide catheter in the right internal carotid artery the combination of the retrieval device, the microcatheter and the Navien guide catheter was retrieved as constant aspiration was applied at the hub of the [facility] guide catheter with a 60 mL syringe, and at the hub of the 5 [place] Navien guide catheter using a Penumbra aspiration vacuum device. Aspiration was continued until the combination was retrieved and removed. Aspiration was also continued as the balloon was deflated in the right internal carotid artery. Free back bleed of blood was noted at the hub of the [facility] guide catheter. The retrieval device contained a few small fibery clots. A gentle control arteriogram performed through the [facility] guide catheter demonstrated revascularization of the occluded inferior division of the right middle cerebral artery with a TICI 2 b revascularization. However, there continued to be a large non flow-limiting clot demonstrated at the origins of the dominant inferior division, and also the superior divisions. This prompted a second pass being made again using the above combination of the Trevo ProVue microcatheter inside of a 5 [place] Navien guide catheter advanced over a 0.014 inch Softip Synchro micro guidewire to the inferior division of the left middle cerebral artery M1 M2 region. Again after verifying safe position of tip of the microcatheter as above, and connected to continuous  heparinized saline infusion, 5 mm x 33 mm Embotrap retrieval device was advanced to the distal end of the microcatheter. Again after having verified the proximal and the landing zones of the retrieval device, this was then deployed again as mentioned above. A gentle control arteriogram performed through the Navien guide catheter in the supraclinoid right ICA demonstrated continued TICI 2b revascularization. With proximal flow arrest in the right internal carotid artery by inflating the balloon of the FlowGate guide catheter, the combination of the retrieval device, the microcatheter, and the 5 [place] Navien guide catheter was then retrieved as constant aspiration was applied with a 60 mL syringe at the hub of the [facility] guide catheter, and with a Penumbra vacuum aspiration device at the hub of the Navien guide catheter. After removal of the combination, the balloon was deflated as aspiration was continued at the hub of the 8 [place] FlowGate guide catheter. No clots were seen in the aspirate or in the interstices of the retrieval device. A control arteriogram performed through the 8 [place] FlowGate guide catheter in the right internal carotid artery continued to demonstrate the clot at the bifurcation of the inferior and the superior divisions. This prompted yet a third attempt again using the above combination as described after deployment of the 5 mm x 33 mm Embotrap retrieval device, and the M1 M2 region of the inferior division. With proximal flow arrest the combination of the retrieval device again was retrieved with the microcatheter and the 5 [place] Navien guide catheter with constant aspiration being applied at the hub of the 5 [place] Navien guide catheter and the [facility] guide catheter. Again the balloon was deflated as aspiration was continued at the hub of the [facility] guide catheter. With back bleed established, a gentle control arteriogram performed through the 8 [place] FlowGate guide catheter in the  right internal carotid artery continued to demonstrate a filling defect now with worsening slow flow in the inferior and superior divisions of the right  middle cerebral artery. At this time a combination of the Trevo ProVue microcatheter inside a 5 French 115 cm Catalyst guide catheter was advanced over a 0.014 inch Softip Synchro micro guidewire to the supraclinoid right ICA. Again with the micro guidewire leading, access was obtained into the distal M1 M2 region of the inferior division followed by the microcatheter. The guidewire was removed. Good aspiration obtained from the hub of the microcatheter. This was then connected to continuous heparinized saline infusion. A 4 mm x 40 mm X Solitaire FR retrieval device was then advanced to the distal end of the microcatheter. The proximal and the distal landing zones were then defined. The O ring on the delivery microcatheter was loosened. With slight forward gentle pressure with the right hand on the delivery micro guidewire with the left hand the delivery microcatheter was retrieved unsheathing the distal and the proximal portion of the retrieval device. A control arteriogram performed through the 5 [place] Catalyst guide catheter in the right middle cerebellar proximally demonstrated a TICI 2b revascularization. The Catalyst guide catheter was advanced to just proximal to the retrieval device. Thereafter, with constant aspiration being applied with a 60 mL syringe at the hub of the [facility] guide catheter, and a Penumbra aspiration device at the hub of the 5 [place] Catalyst guide catheter with proximal flow arrest, the combination was retrieved and removed. Aspiration was continued as proximal flow arrest was reversed. No clots were seen in the aspirate or the retrieval device. A gentle control arteriogram performed through the 8 [place] FlowGate guide catheter in the right internal carotid artery demonstrated continued filling defect at the junction of the superior  and inferior divisions. It was decided to place a rescue stent at the origin of the inferior division of the right middle cerebral artery. A combination of the Trevo ProVue microcatheter inside of a Catalyst guide catheter was then advanced to the supraclinoid right ICA over a 0.014 inch Softip Synchro micro guidewire. With the micro guidewire leading with a J-tip configuration, access was obtained through the occluded inferior division into the M1 M2 region followed by the microcatheter. The guidewire was removed. Good aspiration obtained from the hub of the microcatheter. A gentle control arteriogram demonstrated safe positioning of the tip of the microcatheter with antegrade flow noted. This was then connected to continuous heparinized saline infusion. Measurements were then performed of the length of the stent to be utilized. It was decided to place a 4 mm x 30 mm Enterprise vascular reconstruct device. This was then advanced in a coaxial manner with constant heparinized saline infusion to the distal end of the microcatheter. The landing zone was then aligned at the defined sites of placement of the distal and proximal portion of the stent. Thereafter the O ring on the delivery microcatheter was loosened. With slight forward gentle traction with the right hand on the delivery microcatheter, with the left hand the distal and then the proximal stent were then delivered and deployed without difficulty. The delivery micro guidewire was then removed. Microcatheter was positioned just proximal to the proximal portion of the stent. A control arteriogram performed through the 5 [place] Catalyst guide catheter in the supraclinoid right ICA demonstrated a revascularization of the right middle cerebral distribution. Patency of the right posterior communicating artery and the right anterior cerebral artery is noted. Prior to the position of the stent, the patient was loaded with 180 mg of Brilinta, and 81 mg of aspirin  through an orogastric tube. Control arteriograms were  then performed at 15, 30 and 40 minutes post stent placement. It continued to demonstrate a TICI 3 revascularization. Mild spasm was treated with 2 aliquots of 25 mcg nitroglycerin with positive effect. Throughout the procedure, the patient's blood pressure and neurological status remained stable, A final control arteriogram performed through the 8 [place] FlowGate guide catheter in the right internal carotid artery after removal of the Catalyst guide catheter continued to demonstrate a TICI 3 revascularization with patency of the posterior communicating artery on the right and the right anterior cerebral artery. The FlowGate guide catheter was then retrieved into the abdominal aorta followed by removal of this and the Brite tip neurovascular sheath over a 0.035 inch J-tip guidewire with placement of an 8 French Pinnacle sheath. This was left in situ in view of the patient being on Eliquis. The right groin appeared soft without evidence of hematoma. Distal pulses were palpable in the dorsalis pedis, and posterior tibial regions bilaterally. A Dyna CT of the brain demonstrated a small amount of right peri-insular subarachnoid hemorrhage mixed with contrast. No mass-effect or midline shift was noted. The patient was then extubated. Upon recovery the patient was able to obey simple commands, beginning to lift her left arm and bend her left knee. Her pupils were equal. She was then transported to the neuro ICU to continue with further ischemic stroke management. IMPRESSION: Revascularization of occluded right middle cerebral artery inferior division with 3 passes with the Embotrap 5 mm x 33 mm retrieval device, and 1 pass with the Solitaire FR 4 mm x 40 mm X device achieving a TICI 2b revascularization followed by placement of a rescue stent for reocclusion achieving a TICI 3 revascularization. PLAN: Patient transferred to the neuro ICU for further ischemic stroke  management. Patient will be followed in the clinic 4 weeks post discharge. Electronically Signed   By: Luanne Bras M.D.   On: 05/07/2018 13:20   Ct Head Code Stroke Wo Contrast  Result Date: 05/05/2018 CLINICAL DATA:  Code stroke.  LEFT-sided deficits. EXAM: CT HEAD WITHOUT CONTRAST TECHNIQUE: Contiguous axial images were obtained from the base of the skull through the vertex without intravenous contrast. COMPARISON:  None. FINDINGS: Brain: No evidence for acute infarction, hemorrhage, mass lesion, hydrocephalus, or extra-axial fluid. Generalized atrophy. Hypoattenuation of white matter, likely small vessel disease. Vascular: Calcification of the cavernous internal carotid arteries consistent with cerebrovascular atherosclerotic disease. No signs of intracranial large vessel occlusion. Skull: Calvarium intact.  No worrisome osseous lesion. Sinuses/Orbits: Dense lenticular opacities. No significant sinus fluid. Other: None. ASPECTS [facility] Stroke Program Early CT Score) - Ganglionic level infarction (caudate, lentiform nuclei, internal capsule, insula, M1-M3 cortex): 7 - Supraganglionic infarction (M4-M6 cortex): 3 Total score (0-10 with 10 being normal): 10 IMPRESSION: 1. Atrophy and small vessel disease. No acute intracranial findings. No signs of large vessel occlusion. 2. ASPECTS is 10. These results were communicated to Dr. Lorraine Lax at 8:25 pmon 10/12/2019by text page via the [facility] messaging system. Electronically Signed   By: Staci Righter M.D.   On: 05/05/2018 20:26   Ir Angio Vertebral Sel Subclavian Innominate Uni R Mod Sed  Result Date: 05/08/2018 INDICATION: Left-sided facial droop, left-sided weakness, confusion. Left-sided neglect. 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 EXAM: 1. EMERGENT LARGE VESSEL OCCLUSION THROMBOLYSIS (anterior CIRCULATION) COMPARISON:  CT angiogram of the head and neck of 05/05/2018. MEDICATIONS: Ancef  2 g IV antibiotic was administered within 1 hour of the procedure. ANESTHESIA/SEDATION: General  anesthesia CONTRAST:  Isovue 300 approximately 120 mL FLUOROSCOPY TIME:  Fluoroscopy Time: 73 minutes 24 seconds (2856 mGy). COMPLICATIONS: None immediate. TECHNIQUE: Following a full explanation of the procedure along with the potential associated complications, an informed witnessed consent was obtained from the patient's daughter. The risks of intracranial hemorrhage of 10%, worsening neurological deficit, ventilator dependency, death and inability to revascularize were all reviewed in detail with the patient's daughter. The patient was then put under general anesthesia by the Department of Anesthesiology at [facility]. The right groin was prepped and draped in the usual sterile fashion. Thereafter using modified Seldinger technique, transfemoral access into the right common femoral artery was obtained without difficulty. Over a 0.035 inch guidewire a 5 French Pinnacle sheath was inserted. Through this, and also over a 0.035 inch guidewire a 5 [place] JB 1 catheter was advanced to the aortic arch region and selectively positioned in the innominate artery and the right common carotid artery. FINDINGS: The innominate artery angiogram demonstrates wide patency of the origin of the right subclavian artery and the right common carotid artery. Moderate tortuosity is seen of the proximal right common carotid artery. The right vertebral artery origin demonstrates wide patency with flow demonstrated angiographically to the cranial skull base to the level of the right posterior-inferior cerebellar artery. The right common carotid arteriogram demonstrates the right external carotid artery and its major branches to be widely patent. The right internal carotid artery at the bulb to the cranial skull base demonstrates wide patency. The petrous, cavernous and supraclinoid segments demonstrate normal opacification. The right  posterior communicating artery is seen opacifying the right posterior cerebral distribution. The right middle cerebral artery and the right anterior cerebral artery opacify into the capillary and venous phases. Partial cross-filling via the anterior communicating artery of the left anterior cerebral artery A2 segment is seen. 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 PROCEDURE: ENDOVASCULAR REVASCULARIZATION OF OCCLUDED RIGHT MIDDLE CEREBRAL ARTERY INFERIOR DIVISION PROXIMALLY WITH 3 PASSES WITH THE EMBOTRAP 5 MM X 33 MM RETRIEVAL DEVICE, AND 1 PASS WITH SOLITAIRE FR 4 MM X 40 MM X RETRIEVAL DEVICE ACHIEVING A TICI 2B REVASCULARIZATION, WITH RESCUE STENTING OF REOCCLUSION OF THE INFERIOR DIVISION WITH PLACEMENT OF A 4 MM X 30 MM ENTERPRISE VASCULAR RECONSTRUCT DEVICE ACHIEVING A TICI 3 REVASCULARIZATION. The diagnostic JB 1 catheter in the right common carotid artery was exchanged over a 0.035 inch 300 cm Rosen exchange guidewire for a 55 cm 8 French Brite tip neurovascular sheath using biplane roadmap technique and constant fluoroscopic guidance. Good aspiration obtained from the hub of the Brite tip neurovascular sheath. This was then connected to continuous heparinized saline infusion. Over the 300 cm Rosen exchange guidewire, an 8 [place] 85 cm FlowGate balloon guide catheter which had been prepped with 50% contrast and 50% heparinized saline infusion was advanced and positioned in the proximal 1/3 of the right internal carotid artery. The guidewire was removed. Good aspiration obtained from the hub of the [facility] guide catheter. A gentle control arteriogram demonstrated no evidence of spasms, dissections or of intraluminal filling defects. Over a 0.014 inch Softip Synchro micro guidewire, the combination of a 115 cm 5 [place] Navien guide catheter inside of which was a 0.021 Trevo ProVue microcatheter  was advanced and positioned just distal to the tip of the FlowGate guide catheter in the right internal carotid artery. Thereafter,with the micro guidewire leading with a J-tip configuration to  avoid dissections or inducing spasm, the combination was navigated to the supraclinoid right ICA. The micro guidewire was then manipulated with the torque device and advanced into the right M1 segment followed by the microcatheter. The wire was then guided without difficulty through the occluded right MCA inferior division to the M2 M3 region followed by the microcatheter. The guidewire was removed. Good aspiration obtained from the hub of the microcatheter. A gentle control arteriogram through the hub of the microcatheter demonstrated safe position of tip of the microcatheter. This was then connected to continuous heparinized saline infusion. The 5 [place] Navien guide catheter was advanced to the supraclinoid right ICA distal to the origin of the right posterior communicating artery. A 5 mm x 33 mm Embotrap retrieval device was then advanced to the distal end of the tip of the microcatheter. The O ring on the delivery microcatheter was then loosened. With slight forward gentle traction with the right hand on the delivery micro guidewire, with the left hand, the delivery microcatheter was retrieved unsheathing the distal and the proximal portion of the retrieval device at the defined positions. A gentle control arteriogram performed through the 5 [place] Navien guide catheter in the right internal carotid artery demonstrated no change in the occluded vessel. With proximal flow arrest established by inflating the balloon at the tip of the [facility] guide catheter in the right internal carotid artery the combination of the retrieval device, the microcatheter and the Navien guide catheter was retrieved as constant aspiration was applied at the hub of the [facility] guide catheter with a 60 mL syringe, and at the hub of the 5 [place]  Navien guide catheter using a Penumbra aspiration vacuum device. Aspiration was continued until the combination was retrieved and removed. Aspiration was also continued as the balloon was deflated in the right internal carotid artery. Free back bleed of blood was noted at the hub of the [facility] guide catheter. The retrieval device contained a few small fibery clots. A gentle control arteriogram performed through the [facility] guide catheter demonstrated revascularization of the occluded inferior division of the right middle cerebral artery with a TICI 2 b revascularization. However, there continued to be a large non flow-limiting clot demonstrated at the origins of the dominant inferior division, and also the superior divisions. This prompted a second pass being made again using the above combination of the Trevo ProVue microcatheter inside of a 5 [place] Navien guide catheter advanced over a 0.014 inch Softip Synchro micro guidewire to the inferior division of the left middle cerebral artery M1 M2 region. Again after verifying safe position of tip of the microcatheter as above, and connected to continuous heparinized saline infusion, 5 mm x 33 mm Embotrap retrieval device was advanced to the distal end of the microcatheter. Again after having verified the proximal and the landing zones of the retrieval device, this was then deployed again as mentioned above. A gentle control arteriogram performed through the Navien guide catheter in the supraclinoid right ICA demonstrated continued TICI 2b revascularization. With proximal flow arrest in the right internal carotid artery by inflating the balloon of the FlowGate guide catheter, the combination of the retrieval device, the microcatheter, and the 5 [place] Navien guide catheter was then retrieved as constant aspiration was applied with a 60 mL syringe at the hub of the [facility] guide catheter, and with a Penumbra vacuum aspiration device at the hub of the Navien guide  catheter. After removal of the combination, the balloon was deflated as aspiration was  continued at the hub of the 8 [place] FlowGate guide catheter. No clots were seen in the aspirate or in the interstices of the retrieval device. A control arteriogram performed through the 8 [place] FlowGate guide catheter in the right internal carotid artery continued to demonstrate the clot at the bifurcation of the inferior and the superior divisions. This prompted yet a third attempt again using the above combination as described after deployment of the 5 mm x 33 mm Embotrap retrieval device, and the M1 M2 region of the inferior division. With proximal flow arrest the combination of the retrieval device again was retrieved with the microcatheter and the 5 [place] Navien guide catheter with constant aspiration being applied at the hub of the 5 [place] Navien guide catheter and the [facility] guide catheter. Again the balloon was deflated as aspiration was continued at the hub of the [facility] guide catheter. With back bleed established, a gentle control arteriogram performed through the 8 [place] FlowGate guide catheter in the right internal carotid artery continued to demonstrate a filling defect now with worsening slow flow in the inferior and superior divisions of the right middle cerebral artery. At this time a combination of the Trevo ProVue microcatheter inside a 5 French 115 cm Catalyst guide catheter was advanced over a 0.014 inch Softip Synchro micro guidewire to the supraclinoid right ICA. Again with the micro guidewire leading, access was obtained into the distal M1 M2 region of the inferior division followed by the microcatheter. The guidewire was removed. Good aspiration obtained from the hub of the microcatheter. This was then connected to continuous heparinized saline infusion. A 4 mm x 40 mm X Solitaire FR retrieval device was then advanced to the distal end of the microcatheter. The proximal and the distal landing  zones were then defined. The O ring on the delivery microcatheter was loosened. With slight forward gentle pressure with the right hand on the delivery micro guidewire with the left hand the delivery microcatheter was retrieved unsheathing the distal and the proximal portion of the retrieval device. A control arteriogram performed through the 5 [place] Catalyst guide catheter in the right middle cerebellar proximally demonstrated a TICI 2b revascularization. The Catalyst guide catheter was advanced to just proximal to the retrieval device. Thereafter, with constant aspiration being applied with a 60 mL syringe at the hub of the [facility] guide catheter, and a Penumbra aspiration device at the hub of the 5 [place] Catalyst guide catheter with proximal flow arrest, the combination was retrieved and removed. Aspiration was continued as proximal flow arrest was reversed. No clots were seen in the aspirate or the retrieval device. A gentle control arteriogram performed through the 8 [place] FlowGate guide catheter in the right internal carotid artery demonstrated continued filling defect at the junction of the superior and inferior divisions. It was decided to place a rescue stent at the origin of the inferior division of the right middle cerebral artery. A combination of the Trevo ProVue microcatheter inside of a Catalyst guide catheter was then advanced to the supraclinoid right ICA over a 0.014 inch Softip Synchro micro guidewire. With the micro guidewire leading with a J-tip configuration, access was obtained through the occluded inferior division into the M1 M2 region followed by the microcatheter. The guidewire was removed. Good aspiration obtained from the hub of the microcatheter. A gentle control arteriogram demonstrated safe positioning of the tip of the microcatheter with antegrade flow noted. This was then connected to continuous heparinized saline infusion. Measurements were then performed  of the length of the  stent to be utilized. It was decided to place a 4 mm x 30 mm Enterprise vascular reconstruct device. This was then advanced in a coaxial manner with constant heparinized saline infusion to the distal end of the microcatheter. The landing zone was then aligned at the defined sites of placement of the distal and proximal portion of the stent. Thereafter the O ring on the delivery microcatheter was loosened. With slight forward gentle traction with the right hand on the delivery microcatheter, with the left hand the distal and then the proximal stent were then delivered and deployed without difficulty. The delivery micro guidewire was then removed. Microcatheter was positioned just proximal to the proximal portion of the stent. A control arteriogram performed through the 5 [place] Catalyst guide catheter in the supraclinoid right ICA demonstrated a revascularization of the right middle cerebral distribution. Patency of the right posterior communicating artery and the right anterior cerebral artery is noted. Prior to the position of the stent, the patient was loaded with 180 mg of Brilinta, and 81 mg of aspirin through an orogastric tube. Control arteriograms were then performed at 15, 30 and 40 minutes post stent placement. It continued to demonstrate a TICI 3 revascularization. Mild spasm was treated with 2 aliquots of 25 mcg nitroglycerin with positive effect. Throughout the procedure, the patient's blood pressure and neurological status remained stable, A final control arteriogram performed through the 8 [place] FlowGate guide catheter in the right internal carotid artery after removal of the Catalyst guide catheter continued to demonstrate a TICI 3 revascularization with patency of the posterior communicating artery on the right and the right anterior cerebral artery. The FlowGate guide catheter was then retrieved into the abdominal aorta followed by removal of this and the Brite tip neurovascular sheath over a 0.035  inch J-tip guidewire with placement of an 8 French Pinnacle sheath. This was left in situ in view of the patient being on Eliquis. The right groin appeared soft without evidence of hematoma. Distal pulses were palpable in the dorsalis pedis, and posterior tibial regions bilaterally. A Dyna CT of the brain demonstrated a small amount of right peri-insular subarachnoid hemorrhage mixed with contrast. No mass-effect or midline shift was noted. The patient was then extubated. Upon recovery the patient was able to obey simple commands, beginning to lift her left arm and bend her left knee. Her pupils were equal. She was then transported to the neuro ICU to continue with further ischemic stroke management. IMPRESSION: Revascularization of occluded right middle cerebral artery inferior division with 3 passes with the Embotrap 5 mm x 33 mm retrieval device, and 1 pass with the Solitaire FR 4 mm x 40 mm X device achieving a TICI 2b revascularization followed by placement of a rescue stent for reocclusion achieving a TICI 3 revascularization. PLAN: Patient transferred to the neuro ICU for further ischemic stroke management. Patient will be followed in the clinic 4 weeks post discharge. Electronically Signed   By: Luanne Bras M.D.   On: 05/07/2018 13:20    Labs:  CBC: Recent Labs    05/06/18 0609 05/07/18 0457 05/07/18 0650 05/08/18 0721 05/09/18 0605  WBC 10.6* 8.5  --  10.2 8.7  HGB 8.0* 6.1* 5.8* 8.3* 8.0*  HCT 24.3* 19.1* 18.5* 25.6* 24.5*  PLT 171 143*  --  162 175    COAGS: Recent Labs    05/05/18 2031 05/08/18 1412  INR 1.32  --   APTT 37* 58*  BMP: Recent Labs    05/06/18 0609 05/07/18 0457 05/08/18 0721 05/09/18 0605  NA 139 141 142 141  K 3.3* 3.2* 3.7 3.4*  CL 106 112* 112* 110  CO2 21* 22 20* 23  GLUCOSE 124* 100* 101* 100*  BUN 11 11 8  6*  CALCIUM 9.2 8.9 9.4 9.5  CREATININE 0.68 0.67 0.58 0.54  GFRNONAA >60 >60 >60 >60  GFRAA >60 >60 >60 >60    LIVER  FUNCTION TESTS: Recent Labs    05/05/18 2031  BILITOT 0.5  AST 26  ALT 17  ALKPHOS 153*  PROT 7.2  ALBUMIN 3.5    Assessment and Plan: Pseudoaneurysm Patient s/p thrombectomy with rescue stent placement 05/06/18.  She was placed on Brilinta and aspirin for anti-platelet coverage. She had prolonged bleeding from her groin after procedure necessitating early sheath removal and placement of femstop.  Hemostasis was ultimately achieved however CT Abdomen/Pelvis obtained 05/08/18 revealed right groin pseudoaneurysm.  Vascular surgery has been consulted by primary service.   Lung Lesion CTA of the neck showed pulmonary nodules.  Follow up CT Chest with contrast showed: 1. 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 2. Abnormal appearance of the abdominal retroperitoneum with soft tissue surrounding the proximal celiac. Although this appearance typically is seen with pancreatic carcinoma, given absence of symptoms or abdominal complaints, lymphoma or upper abdominal metastasis from an unknown primary are considerations.  IR consulted for lung lesion biopsy at the request of Dr. Leonie Man.  CT Abdomen/Pelvis obtained to determine additional sites for safe percutaneous biopsy.  Imaging reviewed by Dr. Laurence Ferrari who approves patient for lung nodule biopsy.   Patient made NPO after breakfast this AM.  Heparin held.  May need to coordinate with Vascular surgery if patient with plans for OR repair of pseudoaneurysm today.  Thrombin injection in IR could also be offered.   Update 11:50AM:  Spoke with Vascular surgery team who will not be able to accomodate in OR today.  Due to planned procedure in IR and heparin being held today, plan to do thrombin injection with Dr. Laurence Ferrari.  Patient consented for both pulmonary nodule biopsy as well as thormbin injection of pseudoaneurysm.   Electronically Signed: Docia Barrier, PA 05/09/2018, 11:02 AM   I  spent a total of 15 Minutes at the the patient's bedside AND on the patient's hospital floor or unit, greater than 50% of which was counseling/coordinating care for lung lesions.

## 2018-05-09 NOTE — Care Management Important Message (Signed)
Important Message  Patient Details  Name: Deanna Guerrero MRN: [id number] Date of Birth: 05/05/1941   Medicare Important Message Given:  Yes    Orbie Pyo 05/09/2018, 3:01 PM

## 2018-05-09 NOTE — Procedures (Signed)
Interventional Radiology Procedure Note  Procedure: US guided thrombin injection right CFA pseudoaneurysm with successful thrombosis of the pseudoaneurysm  Complications: None.  2+ palpable PT and DP after treatment  Estimated Blood Loss: None  Recommendations: - Bedrest overnight - Follow up arterial duplex US in am prior to ambulation  Signed,  Criselda Peaches, MD

## 2018-05-10 ENCOUNTER — Other Ambulatory Visit: Payer: Self-pay | Admitting: Neurology

## 2018-05-10 ENCOUNTER — Inpatient Hospital Stay (HOSPITAL_COMMUNITY): Payer: Medicare Other

## 2018-05-10 ENCOUNTER — Other Ambulatory Visit: Payer: Self-pay | Admitting: Student

## 2018-05-10 ENCOUNTER — Encounter (HOSPITAL_COMMUNITY): Payer: Self-pay | Admitting: Interventional Radiology

## 2018-05-10 DIAGNOSIS — E876 Hypokalemia: Secondary | ICD-10-CM

## 2018-05-10 DIAGNOSIS — C7800 Secondary malignant neoplasm of unspecified lung: Secondary | ICD-10-CM | POA: Diagnosis present

## 2018-05-10 DIAGNOSIS — K661 Hemoperitoneum: Secondary | ICD-10-CM | POA: Diagnosis present

## 2018-05-10 DIAGNOSIS — I724 Aneurysm of artery of lower extremity: Secondary | ICD-10-CM

## 2018-05-10 DIAGNOSIS — I609 Nontraumatic subarachnoid hemorrhage, unspecified: Secondary | ICD-10-CM

## 2018-05-10 DIAGNOSIS — I10 Essential (primary) hypertension: Secondary | ICD-10-CM | POA: Diagnosis present

## 2018-05-10 DIAGNOSIS — I4891 Unspecified atrial fibrillation: Secondary | ICD-10-CM | POA: Diagnosis present

## 2018-05-10 DIAGNOSIS — C3410 Malignant neoplasm of upper lobe, unspecified bronchus or lung: Secondary | ICD-10-CM

## 2018-05-10 DIAGNOSIS — K8689 Other specified diseases of pancreas: Secondary | ICD-10-CM | POA: Diagnosis present

## 2018-05-10 DIAGNOSIS — E785 Hyperlipidemia, unspecified: Secondary | ICD-10-CM | POA: Diagnosis present

## 2018-05-10 DIAGNOSIS — D62 Acute posthemorrhagic anemia: Secondary | ICD-10-CM | POA: Diagnosis present

## 2018-05-10 LAB — CBC
HEMATOCRIT: 25.5 % — AB (ref 36.0–46.0)
Hemoglobin: 8.1 g/dL — ABNORMAL LOW (ref 12.0–15.0)
MCH: 26.7 pg (ref 26.0–34.0)
MCHC: 31.8 g/dL (ref 30.0–36.0)
MCV: 84.2 fL (ref 80.0–100.0)
Platelets: 215 10*3/uL (ref 150–400)
RBC: 3.03 MIL/uL — ABNORMAL LOW (ref 3.87–5.11)
RDW: 16.3 % — AB (ref 11.5–15.5)
WBC: 9 10*3/uL (ref 4.0–10.5)
nRBC: 0.3 % — ABNORMAL HIGH (ref 0.0–0.2)

## 2018-05-10 MED ORDER — ASPIRIN 81 MG PO CHEW: 81.0000 mg | CHEWABLE_TABLET | Freq: Every day | ORAL | Status: AC

## 2018-05-10 MED ORDER — TICAGRELOR 90 MG PO TABS: 90.0000 mg | ORAL_TABLET | Freq: Two times a day (BID) | ORAL | 2 refills | Status: AC

## 2018-05-10 MED ORDER — HEPARIN (PORCINE) IN NACL 100-0.45 UNIT/ML-% IJ SOLN: 950.0000 [IU]/h | INTRAMUSCULAR | Status: DC

## 2018-05-10 MED ORDER — TOPIRAMATE 25 MG PO TABS: 25.0000 mg | ORAL_TABLET | Freq: Two times a day (BID) | ORAL | 2 refills | Status: AC

## 2018-05-10 MED ORDER — TICAGRELOR 90 MG PO TABS
90.0000 mg | ORAL_TABLET | Freq: Two times a day (BID) | ORAL | Status: DC
  Administered 2018-05-10: 90 mg via ORAL
  Filled 2018-05-10: qty 1

## 2018-05-10 MED ORDER — TOPIRAMATE 25 MG PO TABS
25.0000 mg | ORAL_TABLET | Freq: Two times a day (BID) | ORAL | Status: DC
  Administered 2018-05-10: 25 mg via ORAL
  Filled 2018-05-10: qty 1

## 2018-05-10 MED ORDER — SENNOSIDES-DOCUSATE SODIUM 8.6-50 MG PO TABS: 1.0000 | ORAL_TABLET | Freq: Every evening | ORAL | Status: AC | PRN

## 2018-05-10 NOTE — Progress Notes (Signed)
D/C instructions provided to patient and family, denies questions/concerns at this time. Patient transported to front entrance via WC, tol well.

## 2018-05-10 NOTE — Progress Notes (Signed)
Right groin for pseudoaneurysm closure recheck completed- Right pseudoaneurysm remains closed post thrombin injection 05/09/2018.  [facility] [place] 05/10/2018 11:31 Am

## 2018-05-10 NOTE — Care Management Note (Addendum)
Case Management Note  Patient Details  Name: Deanna Guerrero MRN: [id number] Date of Birth: Feb 07, 1941  Subjective/Objective:                    Action/Plan: Pt discharging home with [facility] services. CM provided choice and she selected [place]. Butch Penny with [facility] notified and accepted the referral. Pt with orders for walker and 3 in 1. James with [facility] DME notified and will deliver to the room. Pt states her daughter is going to stay with her at least through Sunday. She has transportation home.  Pt contact number: [phone]  Expected Discharge Date:  05/10/18               Expected Discharge Plan:  [place]  In-House Referral:     Discharge planning Services  CM Consult, [place]  Post Acute Care Choice:    Choice offered to:  Patient  DME Arranged:  3-N-1, Walker rolling DME Agency:  [place]:  PT, OT [facility] Agency:  [place]  Status of Service:  Completed, signed off  If discussed at [place] of Stay Meetings, dates discussed:    Additional Comments:  [name], RN 05/10/2018, 1:39 PM

## 2018-05-10 NOTE — Discharge Summary (Addendum)
Stroke Discharge Summary  Patient ID: Deanna Guerrero   MRN: [id number]      DOB: [birth year]  Date of Admission: 05/05/2018 Date of Discharge: 05/10/2018  Attending Physician:  Garvin Fila, MD, Stroke MD Consultant(s):    pulmonary/intensive care (ICU care, lung mets), Willaim Rayas Nicole Kindred) Estanislado Pandy, MD (Interventional Neuroradiologist for mechanical thrombectomy), interventional radiology (lung bx, thrombin injection) Patient's PCP:  No primary care provider on file.  DISCHARGE DIAGNOSIS:  Principal Problem:   Acute right arterial ischemic stroke, middle cerebral artery (MCA) (HCC)  Acute RIGHT MCA M2 occlusion, 3 mm from the bifurcation sendovascular revascularization of RT MCA INF division M2 branch with x 3 passes with embotrap 44mm x 33 mm retriever device and X1 pass with solitaire Fr 67mm x 40 mm retriever device achieving a TICI 2 b revascularization, with rescue stent placement for reocclusion achieving a TICI 3 revascularization. /p  Active Problems:   Middle cerebral artery embolism, right   Atrial fibrillation with RVR (HCC)   Essential hypertension   Hyperlipidemia LDL goal <70   Acute blood loss anemia   Retroperitoneal hematoma, Right, post sheath removal   Metastatic cancer to lung of unknown cell type [facility])   Pancreatic mass   Hypokalemia   SAH (subarachnoid hemorrhage) ([place]) post tPA administration   Past Medical History:  Diagnosis Date  . A-fib ([place])   . Pacemaker    Past Surgical History:  Procedure Laterality Date  . ABDOMINAL HYSTERECTOMY    . IR ANGIO VERTEBRAL SEL SUBCLAVIAN INNOMINATE UNI R MOD SED  05/06/2018  . IR CT HEAD LTD  05/06/2018  . IR INTRA CRAN STENT  05/06/2018  . IR PERCUTANEOUS ART THROMBECTOMY/INFUSION INTRACRANIAL INC DIAG ANGIO  05/06/2018  . IR US GUIDE BX ASP/DRAIN  05/09/2018  . RADIOLOGY WITH ANESTHESIA N/A 05/05/2018   Procedure: RADIOLOGY WITH ANESTHESIA;  Surgeon: Luanne Bras, MD;  Location: [place];  Service:  Radiology;  Laterality: N/A;    Allergies as of 05/10/2018   No Known Allergies     Medication List    STOP taking these medications   ELIQUIS 5 MG Tabs tablet Generic drug:  apixaban   traMADol 50 MG tablet Commonly known as:  ULTRAM     TAKE these medications   amLODipine 5 MG tablet Commonly known as:  NORVASC Take 5 mg by mouth daily.   aspirin 81 MG chewable tablet Chew 1 tablet (81 mg total) by mouth daily. Start taking on:  05/11/2018   diltiazem 120 MG 12 hr capsule Commonly known as:  CARDIZEM SR Take 120 mg by mouth daily.   flecainide 150 MG tablet Commonly known as:  TAMBOCOR Take 150 mg by mouth 2 (two) times daily.   fluticasone 50 MCG/ACT nasal spray Commonly known as:  FLONASE Place 1 spray into both nostrils daily.   guaiFENesin 600 MG 12 hr tablet Commonly known as:  MUCINEX Take 600 mg by mouth every 12 (twelve) hours. **For 14 days**   hydrochlorothiazide 25 MG tablet Commonly known as:  HYDRODIURIL Take 25 mg by mouth daily.   metoprolol succinate 25 MG 24 hr tablet Commonly known as:  TOPROL-XL Take 25 mg by mouth daily.   omeprazole 20 MG capsule Commonly known as:  PRILOSEC Take 20 mg by mouth daily.   potassium chloride SA 20 MEQ tablet Commonly known as:  K-DUR,KLOR-CON Take 20 mEq by mouth 2 (two) times daily.   pravastatin 80 MG tablet Commonly known as:  PRAVACHOL Take  80 mg by mouth daily.   senna-docusate 8.6-50 MG tablet Commonly known as:  Senokot-S Take 1 tablet by mouth at bedtime as needed for mild constipation.   ticagrelor 90 MG Tabs tablet Commonly known as:  BRILINTA Take 1 tablet (90 mg total) by mouth 2 (two) times daily.   tiZANidine 2 MG tablet Commonly known as:  ZANAFLEX Take 2 mg by mouth 3 (three) times daily.   topiramate 25 MG tablet Commonly known as:  TOPAMAX Take 1 tablet (25 mg total) by mouth 2 (two) times daily.            Durable Medical Equipment  (From admission, onward)          Start     Ordered   05/10/18 1258  DME 3-in-1  Once     05/10/18 1301           Discharge Care Instructions  (From admission, onward)         Start     Ordered   05/10/18 0000  Discharge wound care:    Comments:  Keep R groin clean and dry. Ok to shower. Do not sit in tub or other water for significant period of time for the next week.   05/10/18 1301          LABORATORY STUDIES CBC    Component Value Date/Time   WBC 9.0 05/10/2018 0341   RBC 3.03 (L) 05/10/2018 0341   HGB 8.1 (L) 05/10/2018 0341   HCT 25.5 (L) 05/10/2018 0341   PLT 215 05/10/2018 0341   MCV 84.2 05/10/2018 0341   MCH 26.7 05/10/2018 0341   MCHC 31.8 05/10/2018 0341   RDW 16.3 (H) 05/10/2018 0341   LYMPHSABS 0.5 (L) 05/06/2018 0609   MONOABS 0.5 05/06/2018 0609   EOSABS 0.0 05/06/2018 0609   BASOSABS 0.0 05/06/2018 0609   CMP    Component Value Date/Time   NA 141 05/09/2018 0605   K 3.4 (L) 05/09/2018 0605   CL 110 05/09/2018 0605   CO2 23 05/09/2018 0605   GLUCOSE 100 (H) 05/09/2018 0605   BUN 6 (L) 05/09/2018 0605   CREATININE 0.54 05/09/2018 0605   CALCIUM 9.5 05/09/2018 0605   PROT 7.2 05/05/2018 2031   ALBUMIN 3.5 05/05/2018 2031   AST 26 05/05/2018 2031   ALT 17 05/05/2018 2031   ALKPHOS 153 (H) 05/05/2018 2031   BILITOT 0.5 05/05/2018 2031   GFRNONAA >60 05/09/2018 0605   GFRAA >60 05/09/2018 0605   COAGS Lab Results  Component Value Date   INR 1.32 05/05/2018   Lipid Panel    Component Value Date/Time   CHOL 172 05/06/2018 0609   TRIG 89 05/06/2018 0609   HDL 51 05/06/2018 0609   CHOLHDL 3.4 05/06/2018 0609   VLDL 18 05/06/2018 0609   LDLCALC 103 (H) 05/06/2018 0609   HgbA1C  Lab Results  Component Value Date   HGBA1C 4.8 05/06/2018   Urinalysis No results found for: COLORURINE, APPEARANCEUR, LABSPEC, PHURINE, GLUCOSEU, HGBUR, BILIRUBINUR, KETONESUR, PROTEINUR, UROBILINOGEN, NITRITE, LEUKOCYTESUR Urine Drug Screen No results found for: LABOPIA,  COCAINSCRNUR, LABBENZ, AMPHETMU, THCU, LABBARB  Alcohol Level No results found for: [facility]   SIGNIFICANT DIAGNOSTIC STUDIES Ct Head Code Stroke Wo Contrast 05/05/2018 1. Atrophy and small vessel disease. No acute intracranial findings. No signs of large vessel occlusion.  2. ASPECTS is 10.   Ct Angio Head W Or Wo Contrast Ct Angio Neck W Or Wo Contrast 05/05/2018 No extracranial stenosis or dissection. BILATERAL  carotid atheromatous change. 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 Innumerable subcentimeter pulmonary nodules, which could represent infection or metastatic disease. CT chest with contrast could provide additional information.   Cerebral Angiogram 05/06/2018 S/P RT common carotid arteriogram followed by endovascular revascularization of RT MCA INF division M2 branch with x 3 passes with embotrap 69mm x 33 mm retriever device and X1 pass with solitaire Fr 71mm x 40 mm retriever device achieving a TICI 2 b revascularization, with rescue stent placement for reocclusion achieving a TICI 3 revascularization.  Ct Head Wo Contrast 05/06/2018 RIGHT sylvian fissure subarachnoid hemorrhage status post endovascular treatment for RIGHT MCA M2 occlusion. This corresponds to [place] bleeding classification 3C. No evidence for hemorrhagic infarction or parenchymal hematoma. Developing hypoattenuation, RIGHT temporal lobe suspected early cytotoxic edema.   Ct Chest W Contrast 05/06/2018 1. 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 2. Abnormal appearance of the abdominal retroperitoneum with soft tissue surrounding the proximal celiac. Although this appearance typically is seen with pancreatic carcinoma, given absence of symptoms or abdominal complaints, lymphoma or upper abdominal metastasis from an unknown primary are considerations. 3. Consider PET and/or dedicated abdominopelvic contrast-enhanced CT. 4. Small hiatal hernia. 5. Small right  pleural effusion. 6. Coronary artery atherosclerosis. Aortic Atherosclerosis (ICD10-I70.0). 7. Pulmonary artery enlargement suggests pulmonary arterial hypertension. 8. Small volume abdominal ascites. 9. Peripancreatic edema for which superimposed pancreatitis cannot be excluded.    Ct Abdomen Pelvis W Contrast 05/08/2018 Diffuse peripancreatic edema with infiltration of the celiac axis and para-aortic regions associated with peripancreatic and LEFT para-aortic adenopathy, question neoplastic from a poorly defined pancreatic head mass versus infiltrative celiac tumor or inflammatory from pancreatitis. Minimal intrahepatic biliary dilatation LEFT lobe liver of uncertain etiology. Cystic collection at the pancreatic head 24 x 17 x 19 mm could represent a cystic pancreatic neoplasm, pancreatic pseudocyst, or choledochal cyst. Extensive bibasilar pulmonary metastases and small RIGHT pleural effusion. Tiny nonspecific LEFT adrenal nodule 12 x 9 mm. 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   Ct Head Wo Contrast 05/09/2018 1. New subcentimeter focus of hypoattenuation within right mid corona radiata compatible with evolving subacute infarction. 2. Stable subcentimeter cortical hemorrhage within right temporal lobe and small volume of subarachnoid hemorrhage within right Sylvian fissure. 3. No new intracranial hemorrhage identified. 4. Stable chronic microvascular ischemic changes and volume loss of the brain.   Transthoracic Echocardiogram  - Left ventricle: The cavity size was normal. Wall thickness wasincreased in a pattern of mild LVH. Systolic function wasvigorous. The estimated ejection fraction was in the range of 65%to 70%. Wall motion was normal; there were no regional wallmotion abnormalities. Doppler parameters are consistent withabnormal left ventricular relaxation (grade 1 diastolicdysfunction). The Ee&' ratio is between  8-15 suggestingindeterminate LV filling pressure. - Aortic valve: Sclerosis without stenosis. There was mildregurgitation. - Left atrium: The atrium was normal in size. - Right ventricle: The cavity size was normal. Wall thickness wasnormal. Pacer wire or catheter noted in right ventricle. Systolicfunction was normal. - Right atrium: The atrium was normal in size. Pacer wire orcatheter noted in right atrium. - Tricuspid valve: There was trivial regurgitation. - Pulmonary arteries: PA peak pressure: 25 mm Hg (S). - Inferior vena cava: The vessel was normal in size. Therespirophasic diameter changes were in the normal range (>= 50%),consistent with normal central venous pressure. Impressions:   LVEF 65-70%, mild LVH, normal wall motion, grade 1 DD,indeterminate LV filling  pressure, aortic sclerosis with mild AI,trivial TR, RVSP 25 mmHg, normal IVC.    CT guided biopsy of RLL pulmonary nodule US guided thrombin injection right CFA pseudoaneurysm with successful thrombosis of the pseudoaneurysm 37/04/6268 No complications  Right groin for pseudoaneurysm closure recheck  Right pseudoaneurysm remains closed post thrombin injection 05/09/2018.   HISTORY OF PRESENT ILLNESS Deanna Guerrero is an 77 y.o. female with past medical history of atrial fibrillation on Eliquis, recent pacemaker placement presents to the emergency room as a code stroke for left-sided weakness, neglect and facial droop.  She was last seen normal around 7 PM 05/05/2018 by her family.  Around 715p patient had a fall and noted to be weak on the left side.  EMS was called who noted the patient had a facial droop neglect left-sided weakness and called code stroke.  Blood pressure was 485 systolic on arrival.  Patient on Eliquis at home, denies missing medications. NIHSS: 11. Baseline MRS 0  CT head showed no acute abnormality.  She was not a TPA candidate as she was on anticoagulation.  CT angiogram revealed a right  M1-M2 occlusion, and IR was activated.    HOSPITAL COURSE [name] is a 77 y.o. female with history of atrial fibrillation on Eliquis, recent pacemaker placement presenting with left-sided weakness,neglect and facial droop. She did not receive IV t-PA due to anticoagulation. S/P mechanical thrombectomy for RT MCA INF division M2 branch with TICI3 reperfusion and MCA rescue stent. Post procedure CT with mild R SAH. No MRI d/t recent pacer. Repeat CT head stable R SAH and possible R CR small infarct. Infarct embolic, source known AF vs hypercoagulable state from advanced malignancy. Significant bleeding post sheath removal requiring 2 PRBC transfusion. 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 Lung bx and aneurysm thrombin injection completed 05/09/2018. Plan d/c home with [facility] PT. Continue aspirin and brilinta now given stent placement. No anticoagualtion d/t SAH. Repeat CT head in 2 weeks. If SAH resolved, d/c aspirin and place on Eliquis along with Brilinta.  Stroke:  R temporal lobe infarct s/p IR w/ TICI3 revascularization and R MCA stent placement, infarct embolic d/t known AF while on anticoagulation vs. Hypercoagulable state with advanced malignancy.   CT head - Atrophy and small vessel disease. ASPECTS 10  CTA H & N R M2 occlusion 59mm from bifurcation. innumerable subcentimeter pulm nodules infection vs mets.   Angio endovascular revascularization of RT MCA INF division M2 branch with x 3 passes with embotrap 11mm x 33 mm retriever device and X1 pass with solitaire Fr 23mm x 40 mm retriever device achieving a TICI 2 b revascularization, with rescue stent placement for reocclusion achieving a TICI 3 revascularization.  MRI/MRA head - recent ppm placement  2D Echo - EF 65-70%. No source of embolus. Aortic sclerosis.  Repeat CT head R temporal lobe edema, R sylvian fissure SAH  sp IR  Repeat CT head 10/16 - R CR infarct. Stable R temporal hmg/SAH. No new bleeding.   LDL - 103  HgbA1c - 4.8  Eliquis (apixaban) daily prior to admission, now on aspirin 81 mg daily. Re-add Brilinta given stent placement post lung bx and thrombin injection. Plan repeat CT head in 2 weeks. If SAH resolved, d/c aspirin and place on Eliquis along with Brilinta.  Therapy recommendations:  HH PT, HH OT, 3N1  Disposition:  d/c home  Atrial Fibrillation w/ RVR  Home anticoagulation:  Eliquis (apixaban) daily   No eliquis at this time d/t SAH post IR  Resume home Cardizem, Toprol and flecainide   Plan repeat CT head in 2 weeks. If SAH resolved, d/c aspirin and place on Eliquis along with Brilinta.  Hypertension  Stable  Treated w/ Cleviprex in ICU post IR, now off  Home meds:  norvasc 5, cardizem 120, HCTZ 25, toprol 25, flecainide   Now 130-150s  BP goal normotensive  Hyperlipidemia  Lipid lowering medication PTA:  pravachol 80  LDL 103, goal < 70  Current lipid lowering medication:  Lipitor 40 mg daily  Continue home statin at discharge  Other Stroke Risk Factors  Advanced age  Morbid Obesity,, recommend weight loss, diet and exercise as appropriate   UDS / ETOH level not performed   Acute Blood Loss Anemia  Hgb 5.9 post sheath removal  Transfused 2U PRBCs   F/u Hgb 8.3->8.0->8.1  Metastatic Lung disease, likely from ABD source  CTA H & N innumerable subcentimeter pulm nodules infection vs mets.   Chest CT with contrast - Widespread metastatic disease to the lungs and thoracic nodal stations. Abnormal appearance of the abdominal retroperitoneum with soft tissue surrounding the proximal celiac typically is seen with pancreatic carcinoma, Small volume abdominal ascites. Peripancreatic edema for which superimposed pancreatitis cannot be excluded.    Dr. Leonie Man discussed findings w/ pt and dtr  Pulmonary consulted  CT abd & pelvis - pancreatic  mass, lung mets, R inguinal retroperitoneal aneurysm  lung bx 05/09/2018  OP PET scan ordered - dx multiple pulm nodules  Follow up Dr. Lamonte Sakai in 2 weeks  R groin Pseudoaneurysm  Seen on CT abd and pelvis  Pt w/ lots of bleeding post sheath removal, requiring transfusion  Thrombin injection 104/16/2019  Post injection vascular study shows pseudoaneurysm remains closed  Other Active Problems   Hypokalemia - 3.4 - supplement  Mid leukocytosis, resolved  - 10.6->9.0  Headache, R occipital, intermittent and throbbing. Started on topamax  DISCHARGE EXAM Blood pressure (!) 151/65, pulse 66, temperature 97.8 F (36.6 C), temperature source Oral, resp. rate 18, height 5\' 2"  (1.575 m), weight 82.1 kg, SpO2 98 %. Talkative elderly african [place] lady who is awake and alert. Afebrile. Head is nontraumatic. Neck is supple without bruit.Cardiac exam no murmur or gallop. Lungs are clear to auscultation. Distal pulses are well felt. R groin with pelvic bruising, firm and rounded beneath R groin dressing which is CD&I. Neurological Exam :She is alert and oriented to self, place, month, year, appears to be a good historian,  Following commands, moves all extremities to command without drift, sensation intact, EOMI, PERRL, blinks to threat bilat, decreased LUQ vision, face symmetric, tongue midline,spontaneous movements x4, strength 5/5 bilaterally. toes equiv, no clonus. FMM intact. Gait: deferred  Discharge Diet   Regular diet, thin liquids  DISCHARGE PLAN  Disposition:  Return home w/ husband  aspirin 81 mg daily and Brilinta 90 mg bid for secondary stroke prevention. Plan repeat CT head in 2 weeks. If SAH resolved, d/c aspirin and place on Eliquis along with Brilinta.  Ongoing risk factor control by Primary Care Physician at time of discharge  OP PET scan in 1 week (ordered)  Follow up Dr. Lamonte Sakai in 2 weeks - PET scan and lung Bx  Follow-up in [facility] in 4 weeks, office to schedule an appointment.   45 minutes were spent preparing discharge.  Burnetta Sabin, MSN, APRN, ANVP-BC, AGPCNP-BC Advanced Practice  Stroke Nurse [place] for Schedule & Pager information 05/10/2018 1:01 PM   I have personally examined this patient, reviewed notes, independently viewed imaging studies, participated in medical decision making and plan of care.ROS completed by me personally and pertinent positives fully documented  I have made any additions or clarifications directly to the above note. Agree with note above.    Antony Contras, MD Medical Director [facility] Pager: [phone] 05/10/2018 3:43 PM

## 2018-05-10 NOTE — Progress Notes (Addendum)
Referring Physician(s): Dr. Leonie Man  Supervising Physician: Corrie Mckusick  Patient Status:  [facility] - In-pt  Chief Complaint: Lung nodules  Subjective: Possible discharge home today.  [place] groin this AM shows pseudoaneurysm remains closed.   Allergies: Patient has no known allergies.  Medications: Prior to Admission medications   Medication Sig Start Date End Date Taking? Authorizing Provider  amLODipine (NORVASC) 5 MG tablet Take 5 mg by mouth daily.   Yes [provider]  apixaban (ELIQUIS) 5 MG TABS tablet Take 5 mg by mouth 2 (two) times daily.   Yes [provider]  diltiazem (CARDIZEM SR) 120 MG 12 hr capsule Take 120 mg by mouth daily.   Yes [provider]  flecainide (TAMBOCOR) 150 MG tablet Take 150 mg by mouth 2 (two) times daily.   Yes [provider]  fluticasone (FLONASE) 50 MCG/ACT nasal spray Place 1 spray into both nostrils daily.   Yes [provider]  guaiFENesin (MUCINEX) 600 MG 12 hr tablet Take 600 mg by mouth every 12 (twelve) hours. **For 14 days**   Yes [provider]  hydrochlorothiazide (HYDRODIURIL) 25 MG tablet Take 25 mg by mouth daily.   Yes [provider]  metoprolol succinate (TOPROL-XL) 25 MG 24 hr tablet Take 25 mg by mouth daily.   Yes [provider]  omeprazole (PRILOSEC) 20 MG capsule Take 20 mg by mouth daily.   Yes [provider]  potassium chloride SA (K-DUR,KLOR-CON) 20 MEQ tablet Take 20 mEq by mouth 2 (two) times daily.   Yes [provider]  pravastatin (PRAVACHOL) 80 MG tablet Take 80 mg by mouth daily.   Yes [provider]  tiZANidine (ZANAFLEX) 2 MG tablet Take 2 mg by mouth 3 (three) times daily.   Yes [provider]  traMADol (ULTRAM) 50 MG tablet Take 50 mg by mouth 2 (two) times daily as needed for moderate pain.   Yes [provider]     Vital Signs: BP 139/65 (BP Location: Right Arm)   Pulse 62   Temp 97.8  F (36.6 C) (Oral)   Resp 17   Ht 5\' 2"  (1.575 m)   Wt 181 lb (82.1 kg)   SpO2 98%   BMI 33.10 kg/m   Physical Exam  Constitutional: She is oriented to person, place, and time. She appears well-developed. No distress.  Cardiovascular: Normal rate, regular rhythm, normal heart sounds and intact distal pulses. Exam reveals no gallop and no friction rub.  No murmur heard. Distal pulses intact  Pulmonary/Chest: Effort normal and breath sounds normal. No respiratory distress. She has no wheezes.  Abdominal: Soft. She exhibits no distension.  Musculoskeletal: Normal range of motion.  Groin soft.  Non-tender.  No discrete palpable abnormalities.  No bruising.   Neurological: She is alert and oriented to person, place, and time.  Skin: Skin is warm and dry. She is not diaphoretic.  Psychiatric: She has a normal mood and affect. Her behavior is normal. Judgment and thought content normal.  Nursing note and vitals reviewed.     Imaging: Ct Head Wo Contrast  Result Date: 05/09/2018 CLINICAL DATA:  77 y/o  F; stroke follow-up. EXAM: CT HEAD WITHOUT CONTRAST TECHNIQUE: Contiguous axial images were obtained from the base of the skull through the vertex without intravenous contrast. COMPARISON:  05/06/2018 and 05/07/2011 CT head. FINDINGS: Brain: New subcentimeter focus of hypoattenuation within the right mid corona radiata (series 3, image 17) is compatible with evolving subacute infarction. No  large acute vascular territory infarct identified. Stable subcentimeter cortical hemorrhage within the right temporal lobe and small volume of subarachnoid hemorrhage within the right sylvian fissure. No new intracranial hemorrhage identified. Stable background of chronic microvascular ischemic changes and volume loss of the brain. Cavum septum pellucidum. Vascular: Right MCA stent noted. No new hyperdense vessel. Skull: Normal. Negative for fracture or focal lesion. Sinuses/Orbits: Moderate left maxillary  sinus mucosal thickening and mild diffuse mucosal thickening of the additional paranasal sinuses. Normal aeration of the mastoid air cells. Orbits are unremarkable. Other: None. IMPRESSION: 1. New subcentimeter focus of hypoattenuation within right mid corona radiata compatible with evolving subacute infarction. 2. Stable subcentimeter cortical hemorrhage within right temporal lobe and small volume of subarachnoid hemorrhage within right Sylvian fissure. 3. No new intracranial hemorrhage identified. 4. Stable chronic microvascular ischemic changes and volume loss of the brain. Electronically Signed   By: Kristine Garbe M.D.   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 Sagittal and coronal MPR images reconstructed from axial data set. CONTRAST:  173mL OMNIPAQUE IOHEXOL 300 MG/ML SOLN IV. Dilute oral contrast. COMPARISON:  CT chest 05/06/2018 FINDINGS: Lower chest: Small RIGHT pleural effusion. Numerous BILATERAL soft tissue nodules at the lung bases consistent with pulmonary metastases up to 2.1 cm diameter. Hepatobiliary: Question minimal pericholecystic infiltration/edema. Minimal biliary dilatation LEFT lobe liver. Mild periportal edema. No definite hepatic mass lesion Pancreas: Distal pancreatic body and pancreatic tail normal appearance. Cystic lesion at the pancreatic body/head 24 x 17 x 19 mm. Diffuse infiltration of fat planes peripancreatic, at celiac axis, para-aortic, extending to the posterior aspect of the pylorus and to the porta hepatis. Effacement of fat plane between the pancreatic head and duodenum. Attenuation of the LEFT renal vein. Multiple  small peripancreatic and LEFT para-aortic nodes largest an 18 mm LEFT para-aortic node image 20. Spleen: Normal appearance Adrenals/Urinary Tract: 12 x 9 mm LEFT adrenal nodule. RIGHT adrenal gland unremarkable. Small cyst at inferior pole RIGHT kidney 3.1 x 3.3 cm. Kidneys, ureters, and bladder otherwise unremarkable. Stomach/Bowel: Appendix not identified. Scattered colonic diverticulosis. Small hiatal hernia. Stomach and small bowel loops otherwise normal in appearance. Vascular/Lymphatic: Atherosclerotic calcifications aorta without aneurysm. Scattered pelvic phleboliths. Other than above peripancreatic and para-aortic adenopathy in the upper abdomen, no additional abdominal or pelvic adenopathy seen. Multiloculated enhancing beaded appearing collection is seen in the RIGHT inguinal region question pseudoaneurysm extending anterior from the bifurcation of the RIGHT common femoral artery, collection measuring approximately 2.4 x 4.5 x 5.2 cm in greatest size. Reproductive: Uterus surgically absent. Nonvisualization of ovaries. Other: No free air free fluid. Tiny umbilical hernia. Stranding of retroperitoneal tissue planes into the pelvis bilaterally. Musculoskeletal: No acute osseous findings. Degenerative disc disease changes L5-S1. IMPRESSION: Diffuse peripancreatic edema with infiltration of the celiac axis and para-aortic regions associated with peripancreatic and LEFT para-aortic adenopathy, question neoplastic from a poorly defined pancreatic head mass versus infiltrative celiac tumor or inflammatory from pancreatitis. Minimal intrahepatic biliary dilatation LEFT lobe liver of uncertain etiology. Cystic collection at the pancreatic head 24 x 17 x 19 mm could represent a cystic pancreatic neoplasm, pancreatic pseudocyst, or choledochal cyst. Extensive bibasilar pulmonary metastases and small RIGHT pleural effusion. Tiny nonspecific LEFT  adrenal nodule 12 x 9 mm. Large enhancing multilobulated pseudoaneurysm  at the RIGHT inguinal region, overall 2.4 x 4.5 x 5.2 cm in size. Finding of a groin pseudoaneurysm was called to Dr. Leonel Ramsay on 05/08/2018 at hrs. Electronically Signed   By: Lavonia Dana M.D.   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 She presents for ultrasound-guided thrombin injection. EXAM: Ultrasound-guided thrombin injection repair of arterial pseudoaneurysm MEDICATIONS: 700 units thrombin ANESTHESIA/SEDATION: None FLUOROSCOPY TIME:  None COMPLICATIONS: None immediate. PROCEDURE: Informed written consent was obtained from the patient after a thorough discussion of the procedural risks, benefits and alternatives. All questions were addressed. A timeout was performed prior to the initiation of the procedure. The right groin was interrogated with ultrasound. There is a large multiloculated arterial pseudoaneurysm with pulsatile flow. The overlying skin was sterilely prepped and draped in the standard fashion using chlorhexidine skin prep. A total of 5000 units bovine thrombin was reconstituted in 5 mL of normal saline. This was then transferred to a 1 cc tuberculin syringe. Under real-time sonographic guidance, the arterial pseudoaneurysm was punctured using a 21 gauge micropuncture needle. Thrombin was then slowly injected through the needle in 100-200 units aliquots until there was complete thrombosis of the largest in most superficial portion of the pseudoaneurysm. This initial injection also thrombosed the slightly deeper portion of the pseudoaneurysm. However, the smallest and deepest portion of the pseudoaneurysm just above the common femoral artery remained patent and pulsatile. Therefore, the needle was redirected under real-time sonographic guidance into this collection and an additional 200 units of thrombin were injected. This was successful.  There was no evidence of residual arterial flow within the pseudoaneurysms at the end of the procedure. The common femoral artery remains patent. The patient has a bounding posterior tibial and dorsalis pedis pulse. A total of 700 units thrombin were injected. IMPRESSION: Successful thrombin injection with resolution of arterial flow in the multiloculated pseudoaneurysm. Signed, Criselda Peaches, MD, [facility] Electronically Signed   By: Jacqulynn Cadet M.D.   On: 05/10/2018 07:49   Ct Biopsy  Result Date: 05/10/2018 INDICATION: 76 year old female with numerous bilateral pulmonary nodules concerning for metastatic disease of uncertain primary origin. She presents for CT-guided biopsy of the same. EXAM: CT-guided biopsy right lower lobe pulmonary nodule Interventional Radiologist:  Criselda Peaches, MD MEDICATIONS: None. ANESTHESIA/SEDATION: Fentanyl 100 mcg IV; Versed 2 mg IV Moderate Sedation Time:  15 minutes The patient was continuously monitored during the procedure by the interventional radiology nurse under my direct supervision. FLUOROSCOPY TIME:  None COMPLICATIONS: None immediate. Estimated blood loss:  0 PROCEDURE: Informed written consent was obtained from the patient after a thorough discussion of the procedural risks, benefits and alternatives. All questions were addressed. Maximal Sterile Barrier Technique was utilized including caps, mask, sterile gowns, sterile gloves, sterile drape, hand hygiene and skin antiseptic. A timeout was performed prior to the initiation of the procedure. A planning axial CT scan was performed. The nodule in the right lower lobe was successfully identified. A suitable skin entry site was selected and marked. The region was then sterilely prepped and draped in standard fashion using Betadine skin prep. Local anesthesia was attained by infiltration with 1% lidocaine. A small dermatotomy was made.  Under intermittent CT fluoroscopic guidance, a 17 gauge trocar needle was advanced into the lung and positioned at  the margin of the nodule. Multiple 18 gauge core biopsies were then coaxially obtained using the BioPince automated biopsy device. Biopsy specimens were placed in formalin and delivered to pathology for further analysis. The biopsy device and introducer needle were removed. Post biopsy axial CT imaging demonstrates no evidence of immediate complication. A bio sentry device was deployed. There is no pneumothorax. Mild perilesional alveolar hemorrhage is not unexpected. The patient tolerated the procedure well. IMPRESSION: Technically successful CT-guided biopsy right lower lobe pulmonary nodule. Electronically Signed   By: Jacqulynn Cadet M.D.   On: 05/10/2018 07:50    Labs:  CBC: Recent Labs    05/07/18 0457 05/07/18 0650 05/08/18 0721 05/09/18 0605 05/10/18 0341  WBC 8.5  --  10.2 8.7 9.0  HGB 6.1* 5.8* 8.3* 8.0* 8.1*  HCT 19.1* 18.5* 25.6* 24.5* 25.5*  PLT 143*  --  162 175 215    COAGS: Recent Labs    05/05/18 2031 05/08/18 1412  INR 1.32  --   APTT 37* 58*    BMP: Recent Labs    05/06/18 0609 05/07/18 0457 05/08/18 0721 05/09/18 0605  NA 139 141 142 141  K 3.3* 3.2* 3.7 3.4*  CL 106 112* 112* 110  CO2 21* 22 20* 23  GLUCOSE 124* 100* 101* 100*  BUN 11 11 8  6*  CALCIUM 9.2 8.9 9.4 9.5  CREATININE 0.68 0.67 0.58 0.54  GFRNONAA >60 >60 >60 >60  GFRAA >60 >60 >60 >60    LIVER FUNCTION TESTS: Recent Labs    05/05/18 2031  BILITOT 0.5  AST 26  ALT 17  ALKPHOS 153*  PROT 7.2  ALBUMIN 3.5    Assessment and Plan: Pseudoaneurysm Remains closed s/p thrombin injection.  Physical exam benign.   Lung Lesion Stable after biopsy yesterday.  No difficulty breathing.  CXR not obtained yesterday, however breathing comfortably room air.  Will get CXR prior to d/c home today. This has been ordered.   R MCA stent placement with Dr.  Estanislado Pandy Schedulers will contact patient with date and time of follow-up appointment.  Plan is to d/c home on Brilinta per Neuro.   Electronically Signed: Docia Barrier, PA 05/10/2018, 1:47 PM   I spent a total of 15 Minutes at the the patient's bedside AND on the patient's hospital floor or unit, greater than 50% of which was counseling/coordinating care for lung lesions.

## 2018-05-10 NOTE — Progress Notes (Signed)
Physical Therapy Treatment Patient Details Name: Deanna Guerrero MRN: [id number] DOB: 02-Jul-1941 Today's Date: 05/10/2018    History of Present Illness Deanna Guerrero is an 77 y.o. female with past medical history of atrial fibrillation on Eliquis, recent pacemaker placement presents to the emergency room as a code stroke for left-sided weakness and neglect and facial droop. CT angiogram revealed R MT-M2 occulsion and IR was activated.    PT Comments    Much improved.  Now mobilizing without the required need for the RW, though I expect she may decide to use it for confidence at times.   Follow Up Recommendations  Home health PT;Supervision/Assistance - 24 hour     Equipment Recommendations  Rolling walker with 5" wheels    Recommendations for Other Services       Precautions / Restrictions      Mobility  Bed Mobility               General bed mobility comments: OOB on arrival  Transfers Overall transfer level: Needs assistance Equipment used: Rolling walker (2 wheeled) Transfers: Sit to/from Stand Sit to Stand: Min guard            Ambulation/Gait Ambulation/Gait assistance: Min guard Gait Distance (Feet): 350 Feet Assistive device: Rolling walker (2 wheeled) Gait Pattern/deviations: Step-through pattern;Decreased stride length   Gait velocity interpretation: 1.31 - 2.62 ft/sec, indicative of limited community ambulator General Gait Details: Generally steady without assistive device.  Pt managed well with moderate challenge including scanning, directional changes and significant increase in speed without noticeable deviation.   Stairs             Wheelchair Mobility    Modified Rankin (Stroke Patients Only) Modified Rankin (Stroke Patients Only) Pre-Morbid Rankin Score: No symptoms Modified Rankin: Moderate disability     Balance Overall balance assessment: Needs assistance   Sitting balance-Leahy Scale: Good       Standing  balance-Leahy Scale: Fair                              Cognition Arousal/Alertness: Awake/alert Behavior During Therapy: WFL for tasks assessed/performed Overall Cognitive Status: Within Functional Limits for tasks assessed                                        Exercises      General Comments        Pertinent Vitals/Pain Pain Assessment: No/denies pain    Home Living                      Prior Function            PT Goals (current goals can now be found in the care plan section) Acute Rehab PT Goals Patient Stated Goal: go home PT Goal Formulation: With patient Time For Goal Achievement: 05/21/18 Potential to Achieve Goals: Good Progress towards PT goals: Progressing toward goals    Frequency           PT Plan Current plan remains appropriate    Co-evaluation              AM-PAC PT "6 Clicks" Daily Activity  Outcome Measure  Difficulty turning over in bed (including adjusting bedclothes, sheets and blankets)?: A Little Difficulty moving from lying on back to sitting on the side of the bed? : A  Little Difficulty sitting down on and standing up from a chair with arms (e.g., wheelchair, bedside commode, etc,.)?: A Little Help needed moving to and from a bed to chair (including a wheelchair)?: A Little Help needed walking in hospital room?: A Little Help needed climbing 3-5 steps with a railing? : A Little 6 Click Score: 18    End of Session   Activity Tolerance: Patient tolerated treatment well Patient left: in chair;with call bell/phone within reach;with nursing/sitter in room Nurse Communication: Mobility status PT Visit Diagnosis: Unsteadiness on feet (R26.81)     Time: 2162-4469 PT Time Calculation (min) (ACUTE ONLY): 9 min  Charges:  $Gait Training: 8-22 mins                     05/10/2018  Donnella Sham, PT Acute Rehabilitation Services [phone]  (pager) [phone]  (office)   Deanna Guerrero 05/10/2018, 6:05 PM

## 2018-05-10 NOTE — Telephone Encounter (Signed)
Pt is still currently admitted. 

## 2018-05-10 NOTE — Progress Notes (Signed)
OT Cancellation Note  Patient Details Name: Deanna Guerrero MRN: [id number] DOB: 1941-01-20   Cancelled Treatment:     Reason Evaluation/Treatment not completed/Pt not seen: Pt is currently off floor for testing. Will check back for OT next available date as able.  Carlynn Herald Amy Beth Dixon, OTR/L 05/10/2018, 10:51 AM

## 2018-05-11 NOTE — Telephone Encounter (Signed)
Pt has been discharged. I attempted to contact the pt but the number have on file for her is to [employer] and [employer] at [employer]. There aren't any other numbers listed in the pt's chart.  RB - please advise what you want to do. Thanks!

## 2018-05-14 ENCOUNTER — Telehealth: Payer: Self-pay

## 2018-05-14 NOTE — Telephone Encounter (Signed)
A family member of the pt's called in. An appointment has been scheduled with Dr. Loanne Drilling. PET scan order has been placed. Nothing further was needed at this time.

## 2018-05-14 NOTE — Telephone Encounter (Addendum)
Katrina, Spoke with Dr. Leonie Man . Will you please CX Dr. Clydene Fake PET scan order on patient. We are going to proceed with  Collene Gobble, MD  Order . Thanks Katrina .

## 2018-05-14 NOTE — Telephone Encounter (Signed)
Agree with plan as stated in [place] note

## 2018-05-15 ENCOUNTER — Other Ambulatory Visit: Payer: Self-pay

## 2018-05-15 ENCOUNTER — Telehealth (HOSPITAL_COMMUNITY): Payer: Self-pay

## 2018-05-15 NOTE — Patient Outreach (Signed)
[facility]) Care Management  05/15/2018  Deanna Guerrero 1940/08/25 [id number]   EMMI- Stroke not on APL RED ON [place] Day # 3 Date:  05/14/18 Red Alert Reason:  Questions/problems with meds? Yes  Outreach attempt: spoke with patient.  She is able to verify HIPAA. She states that they had some questions about her medication.  Daughter states that she had missed giving her mother a medication for 3 days.  She did not give the name of medication.  Advised her that she could not double up on medications but just to proceed with medication as ordered.  She states that [employer] is present and will discuss with them.  Patient states she has appointments set with PCP and Neurologist this month. Patient denies any questions or concerns.     Plan: RN CM will close case.    Jone Baseman, RN, MSN [facility] Care Management Care Management Coordinator Direct Line [phone] Toll Free: [phone]  Fax: [phone]

## 2018-05-15 NOTE — Telephone Encounter (Signed)
Called to schedule f/u, number in system is invalid. AW

## 2018-05-18 ENCOUNTER — Telehealth: Payer: Self-pay | Admitting: Emergency Medicine

## 2018-05-18 NOTE — Telephone Encounter (Signed)
Called and spoke with Saubia at [facility] in regards to a PET order. She stated that the order needs to be signed by Dr. Lamonte Sakai.  I advised her that he would be back at the office 10/28 and he would sign the order once he returns. Patient is scheduled to have PET at 2pm and I stated to her that the order would be signed before then as he has clinic that day and should be able to sign the order once he arrives for clinic.  Routing to Dr. Lamonte Sakai as an FYI that the order needs to be signed as soon as he can.

## 2018-05-21 ENCOUNTER — Encounter (HOSPITAL_COMMUNITY): Admission: RE | Admit: 2018-05-21 | Payer: Medicare Other | Source: Ambulatory Visit

## 2018-05-21 NOTE — Telephone Encounter (Signed)
Signed 10/28

## 2018-05-21 NOTE — Telephone Encounter (Signed)
Deanna Guerrero calling checking on PET order has been signed by Dr. Lamonte Sakai.  Phone number is [phone].

## 2018-05-24 ENCOUNTER — Encounter: Payer: Self-pay | Admitting: Pulmonary Disease

## 2018-05-24 ENCOUNTER — Ambulatory Visit (INDEPENDENT_AMBULATORY_CARE_PROVIDER_SITE_OTHER): Payer: Medicare Other | Admitting: Pulmonary Disease

## 2018-05-24 VITALS — BP 120/64 | HR 71 | Ht 62.0 in | Wt 160.2 lb

## 2018-05-24 DIAGNOSIS — C7800 Secondary malignant neoplasm of unspecified lung: Secondary | ICD-10-CM | POA: Diagnosis not present

## 2018-05-24 DIAGNOSIS — C259 Malignant neoplasm of pancreas, unspecified: Secondary | ICD-10-CM

## 2018-05-24 NOTE — Progress Notes (Signed)
Synopsis: Referred in 04/2018 for multiple pulmonary nodules  Subjective:   PATIENT ID: Deanna Guerrero: female DOB: 21-Aug-1940, MRN: [id number]   HPI  Chief Complaint  Patient presents with  . CONSULT    went to PCP appt following a stroke.  Was told she needed to come here but patient seems unclear.     [name] is a 77 year old female with recent MCA CVA, atrial fibrillation, SAH, pancreatic mass who presents as pulmonary hospital follow-up for multiple lung nodules.  Hospital discharge summary 05/10/2018 (reviewed and summarized): Admitted 05/05/18-05/10/18 for acute right MCA ischemic stroke s/p thrombectomy and stent complicated by [facility]. Incidental imaging concerning for pancreatic cancer with bilateral lung metastasis. Pulmonary consulted. Underwent CT guided biopsy of RLL pulmonary lung nodule on 05/09/18.  Prior to discharge exam noted normal neuro physical exam.  Since discharge, she reports she has had decreased appetite and energy. Prior to being hospitalized she was eating fair and independent. At home she is able to perform regular activities.  She states she is not sure why she is here in pulmonary clinic and was told that she needed to follow-up.  She recalls being told he had an abnormal x-ray and had to get a CT scan but thought the abnormalities had resolved. Does not recall getting a percutaneous lung biopsy. Denies respiratory symptoms. No cough, hemoptysis, shortness of breath, wheezing, or chest/pleuritic pain.  Denies fevers, chills, unintentional weight loss or night sweats.  No prior history of cancer.  She is a remote smoker, 15-pack-year history, quit 40 years ago.  Denies inhalation exposures.   Former smoker 1/2-1ppd x 15 years Environmental exposures: Denied any unusual exposure  I have personally reviewed patient's past medical/family/social history, allergies, current medications.  Past Medical History:  Diagnosis Date  . A-fib ([place])   .  CVA (cerebral vascular accident) ([place])   . Hyperlipidemia   . Hypertension   . Pacemaker      Family History  Problem Relation Age of Onset  . Heart disease Mother   . Heart attack Mother   . Heart Problems Mother      Social History   Socioeconomic History  . Marital status: Unknown    Spouse name: Not on file  . Number of children: Not on file  . Years of education: Not on file  . Highest education level: Not on file  Occupational History  . Not on file  Social Needs  . Financial resource strain: Not on file  . Food insecurity:    Worry: Not on file    Inability: Not on file  . Transportation needs:    Medical: Not on file    Non-medical: Not on file  Tobacco Use  . Smoking status: Former Smoker    Packs/day: 0.50    Last attempt to quit: 1989    Years since quitting: 30.8  . Smokeless tobacco: Never Used  Substance and Sexual Activity  . Alcohol use: Never    Frequency: Never  . Drug use: Never  . Sexual activity: Not on file  Lifestyle  . Physical activity:    Days per week: Not on file    Minutes per session: Not on file  . Stress: Not on file  Relationships  . Social connections:    Talks on phone: Not on file    Gets together: Not on file    Attends religious service: Not on file    Active member of club or organization: Not on file  Attends meetings of clubs or organizations: Not on file    Relationship status: Not on file  . Intimate partner violence:    Fear of current or ex partner: Not on file    Emotionally abused: Not on file    Physically abused: Not on file    Forced sexual activity: Not on file  Other Topics Concern  . Not on file  Social History Narrative  . Not on file     No Known Allergies   Outpatient Medications Prior to Visit  Medication Sig Dispense Refill  . amLODipine (NORVASC) 5 MG tablet Take 5 mg by mouth daily.    Marland Kitchen aspirin 81 MG chewable tablet Chew 1 tablet (81 mg total) by mouth daily.    Marland Kitchen diltiazem (CARDIZEM  SR) 120 MG 12 hr capsule Take 120 mg by mouth daily.    . flecainide (TAMBOCOR) 150 MG tablet Take 150 mg by mouth 2 (two) times daily.    . fluticasone (FLONASE) 50 MCG/ACT nasal spray Place 1 spray into both nostrils daily.    Marland Kitchen guaiFENesin (MUCINEX) 600 MG 12 hr tablet Take 600 mg by mouth every 12 (twelve) hours. **For 14 days**    . hydrochlorothiazide (HYDRODIURIL) 25 MG tablet Take 25 mg by mouth daily.    . metoprolol succinate (TOPROL-XL) 25 MG 24 hr tablet Take 25 mg by mouth daily.    Marland Kitchen omeprazole (PRILOSEC) 20 MG capsule Take 20 mg by mouth daily.    . potassium chloride SA (K-DUR,KLOR-CON) 20 MEQ tablet Take 20 mEq by mouth 2 (two) times daily.    . pravastatin (PRAVACHOL) 80 MG tablet Take 80 mg by mouth daily.    Marland Kitchen senna-docusate (SENOKOT-S) 8.6-50 MG tablet Take 1 tablet by mouth at bedtime as needed for mild constipation.    . ticagrelor (BRILINTA) 90 MG TABS tablet Take 1 tablet (90 mg total) by mouth 2 (two) times daily. 60 tablet 2  . topiramate (TOPAMAX) 25 MG tablet Take 1 tablet (25 mg total) by mouth 2 (two) times daily. 60 tablet 2  . tiZANidine (ZANAFLEX) 2 MG tablet Take 2 mg by mouth 3 (three) times daily.     No facility-administered medications prior to visit.     Review of Systems  Constitutional: Negative for chills, diaphoresis, fever, malaise/fatigue and weight loss.  HENT: Negative for congestion and sore throat.   Respiratory: Negative for cough, hemoptysis, sputum production, shortness of breath and wheezing.   Cardiovascular: Negative for chest pain, palpitations, orthopnea, leg swelling and PND.  Gastrointestinal: Negative for abdominal pain, heartburn and nausea.  Genitourinary: Negative for frequency and urgency.  Musculoskeletal: Negative for myalgias.  Skin: Negative for rash.  Neurological: Negative for dizziness, weakness and headaches.  Endo/Heme/Allergies: Does not bruise/bleed easily.  All other systems reviewed and are  negative.     Objective:  Physical Exam  Constitutional: She is oriented to person, place, and time. She appears well-developed and well-nourished. No distress.  HENT:  Head: Normocephalic and atraumatic.  Nose: Nose normal.  Mouth/Throat: No oropharyngeal exudate.  Eyes: Pupils are equal, round, and reactive to light. Conjunctivae and EOM are normal. No scleral icterus.  Neck: Normal range of motion. Neck supple. No JVD present. No tracheal deviation present.  Cardiovascular: Normal rate, regular rhythm, normal heart sounds and intact distal pulses. Exam reveals no gallop and no friction rub.  No murmur heard. Pulmonary/Chest: Effort normal and breath sounds normal. No stridor. No respiratory distress. She has no wheezes. She has  no rales.  Abdominal: Soft. Bowel sounds are normal. She exhibits no distension. There is no tenderness.  Musculoskeletal: Normal range of motion. She exhibits no edema or deformity.  Neurological: She is alert and oriented to person, place, and time. No cranial nerve deficit or sensory deficit. Coordination normal.  Skin: Skin is warm and dry. No rash noted. She is not diaphoretic. No erythema. No pallor.  Psychiatric: She has a normal mood and affect. Her behavior is normal. Judgment and thought content normal.  Vitals reviewed.    Vitals:   05/24/18 1035  BP: 120/64  Pulse: 71  SpO2: 98%  Weight: 160 lb 3.2 oz (72.7 kg)  Height: 5\' 2"  (1.575 m)    CBC    Component Value Date/Time   WBC 9.0 05/10/2018 0341   RBC 3.03 (L) 05/10/2018 0341   HGB 8.1 (L) 05/10/2018 0341   HCT 25.5 (L) 05/10/2018 0341   PLT 215 05/10/2018 0341   MCV 84.2 05/10/2018 0341   MCH 26.7 05/10/2018 0341   MCHC 31.8 05/10/2018 0341   RDW 16.3 (H) 05/10/2018 0341   LYMPHSABS 0.5 (L) 05/06/2018 0609   MONOABS 0.5 05/06/2018 0609   EOSABS 0.0 05/06/2018 0609   BASOSABS 0.0 05/06/2018 0609     Chest imaging: CT Chest W Contrast 05/06/18 IMPRESSION: 1. Widespread  metastatic disease to the lungs and thoracic nodal stations. 2. Abnormal appearance of the abdominal retroperitoneum with soft tissue surrounding the proximal celiac. Although this appearance typically is seen with pancreatic carcinoma, given absence of symptoms or abdominal complaints, lymphoma or upper abdominal metastasis from an unknown primary are considerations. 3. Consider PET and/or dedicated abdominopelvic contrast-enhanced CT. 4. Small hiatal hernia. 5. Small right pleural effusion. 6. Coronary artery atherosclerosis. Aortic Atherosclerosis (ICD10-I70.0). 7. Pulmonary artery enlargement suggests pulmonary arterial hypertension. 8. Small volume abdominal ascites. 9. Peripancreatic edema for which superimposed pancreatitis cannot be excluded.  CT Abdomen Pelvis 05/08/18 IMPRESSION: Diffuse peripancreatic edema with infiltration of the celiac axis and para-aortic regions associated with peripancreatic and LEFT para-aortic adenopathy, question neoplastic from a poorly defined pancreatic head mass versus infiltrative celiac tumor or inflammatory from pancreatitis.  Minimal intrahepatic biliary dilatation LEFT lobe liver of uncertain etiology.  Cystic collection at the pancreatic head 24 x 17 x 19 mm could represent a cystic pancreatic neoplasm, pancreatic pseudocyst, or choledochal cyst.  Extensive bibasilar pulmonary metastases and small RIGHT pleural effusion.  Tiny nonspecific LEFT adrenal nodule 12 x 9 mm.  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  Finding of a groin pseudoaneurysm was called to Dr. Leonel Ramsay on 05/08/2018 at hrs.  Path:   CT guided biopsy of RLL pulmonary lung nodule 05/09/18:  ADENOCARCINOMA WITH MUCINOUS FEATURES.   I have personally reviewed the above labs, images and tests noted above.    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 Pathology consistent with mucinous adenocarcinoma. From pulmonary standpoint, patient is remote smoker however asymptomatic and does not warrant further work-up.  Pancreatic cancer with metastasis to lung I spent a 5 minutes face-to-face with the patient, over half in discussion of the diagnosis and the importance of compliance with the treatment plan. --We will order referral to Oncology --No bronchoscopic evaluation indicated  Follow-up with Pulmonary as needed   Chi Rodman Pickle, MD [facility]  05/24/2018 11:05 AM  Personal pager: [phone] If unanswered, please page [facility]CM On-call: [phone]    Current Outpatient Medications:  .  amLODipine (NORVASC) 5 MG tablet, Take 5 mg by mouth daily., Disp: , Rfl:  .  aspirin 81 MG chewable tablet, Chew 1 tablet (81 mg total) by mouth daily., Disp: , Rfl:  .  diltiazem (CARDIZEM SR) 120 MG 12 hr capsule, Take 120 mg by mouth daily., Disp: , Rfl:  .  flecainide (TAMBOCOR) 150 MG tablet, Take 150 mg by mouth 2 (two) times daily., Disp: , Rfl:  .  fluticasone (FLONASE) 50 MCG/ACT nasal spray, Place 1 spray into both nostrils daily., Disp: , Rfl:  .  guaiFENesin (MUCINEX) 600 MG 12 hr tablet, Take 600 mg by mouth every 12 (twelve) hours. **For 14 days**, Disp: , Rfl:  .  hydrochlorothiazide (HYDRODIURIL) 25 MG tablet, Take 25 mg by mouth daily., Disp: , Rfl:  .  metoprolol succinate (TOPROL-XL) 25 MG 24 hr tablet, Take 25 mg by mouth daily., Disp: , Rfl:  .  omeprazole (PRILOSEC) 20 MG capsule, Take 20 mg by mouth daily., Disp: , Rfl:  .  potassium chloride SA (K-DUR,KLOR-CON) 20 MEQ tablet, Take 20 mEq by mouth 2 (two) times daily., Disp: , Rfl:  .  pravastatin (PRAVACHOL) 80 MG tablet, Take 80 mg by mouth daily., Disp: , Rfl:  .  senna-docusate (SENOKOT-S) 8.6-50 MG tablet, Take 1  tablet by mouth at bedtime as needed for mild constipation., Disp: , Rfl:  .  ticagrelor (BRILINTA) 90 MG TABS tablet, Take 1 tablet (90 mg total) by mouth 2 (two) times daily., Disp: 60 tablet, Rfl: 2 .  topiramate (TOPAMAX) 25 MG tablet, Take 1 tablet (25 mg total) by mouth 2 (two) times daily., Disp: 60 tablet, Rfl: 2 .  tiZANidine (ZANAFLEX) 2 MG tablet, Take 2 mg by mouth 3 (three) times daily., Disp: , Rfl:

## 2018-05-24 NOTE — Patient Instructions (Signed)
Pancreatic cancer with metastasis to lung --We will order referral to Oncology --No bronchoscopic evaluation indicated  Follow-up with Pulmonary as needed

## 2018-05-30 ENCOUNTER — Encounter: Payer: Self-pay | Admitting: *Deleted

## 2018-05-30 NOTE — Progress Notes (Signed)
Called and spoke to patient's daughter, Deanna Guerrero at [phone]. Introduced myself and explained referral process and desire to have patient scheduled for her initial visit. Daughter states that they have talked, and at this time, they are refusing the referral stating that they will place their faith in God. Ensured that Deanna Guerrero had our office phone number to reach out to [place] if they changed their mind and wanted to be seen.

## 2018-06-18 ENCOUNTER — Ambulatory Visit: Payer: Medicare Other | Admitting: Adult Health

## 2018-07-27 ENCOUNTER — Other Ambulatory Visit: Payer: Self-pay

## 2018-07-27 NOTE — Patient Outreach (Signed)
Telephone outreach to patient to obtain mRs was successfully completed. mRs= 2. Patient's daughter answered the questions per the DPR on file. The only issue mentioned was a lack of appetite. Patient is keeping her appointments with her [facility] PCP.

## 2018-08-25 DEATH — deceased

## 2019-03-17 IMAGING — CT CT BIOPSY
1 of 3 series · 13 of 32 positions shown, 18 images · non-contrast
Comparison: none

INDICATION: 77-year-old female with numerous bilateral pulmonary nodules
concerning for metastatic disease of uncertain primary origin. She
presents for CT-guided biopsy of the same.

[Series 2: i-spiral 5.0 b40f · axial · 0.75mm/px · z∈[+1083,+1265]mm · 13 of 62 slices shown, 18 images]
[im 5/62  soft-tissue]
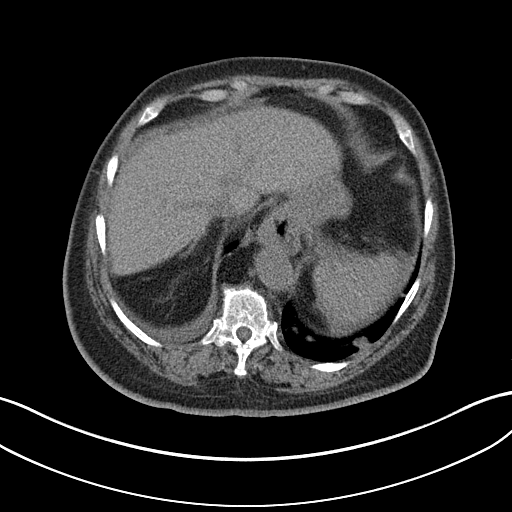
[im 5/62  bone]
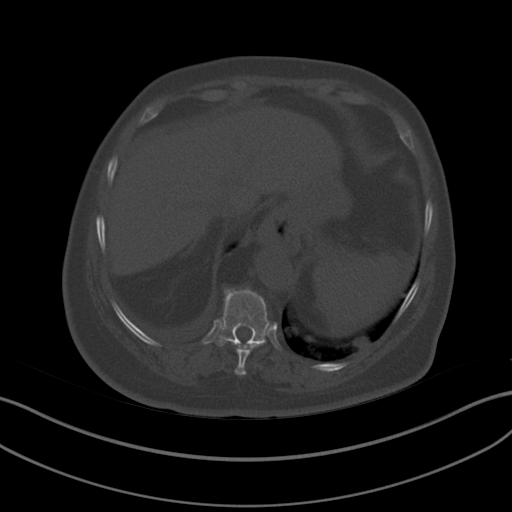
[im 9/62  soft-tissue]
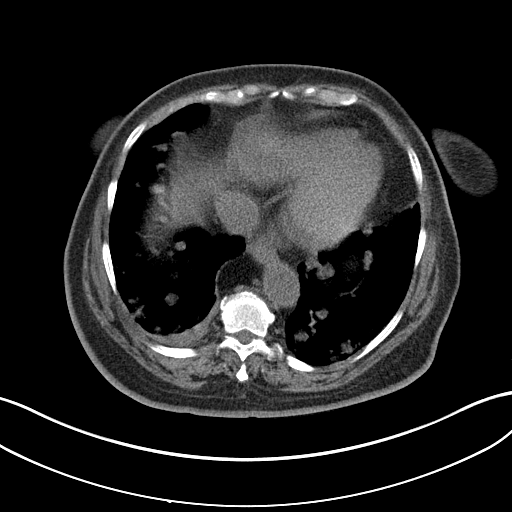
[im 13/62  soft-tissue]
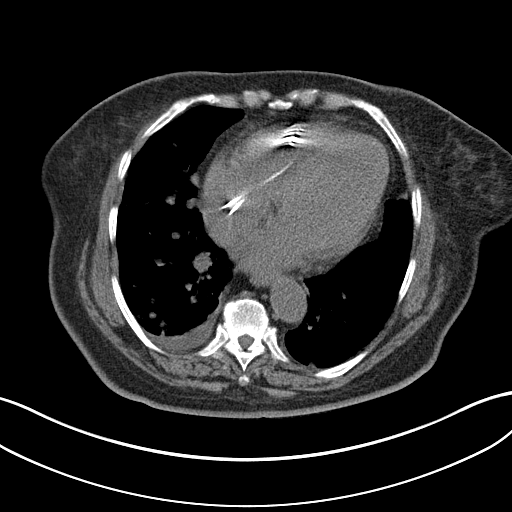
[im 21/62  soft-tissue]
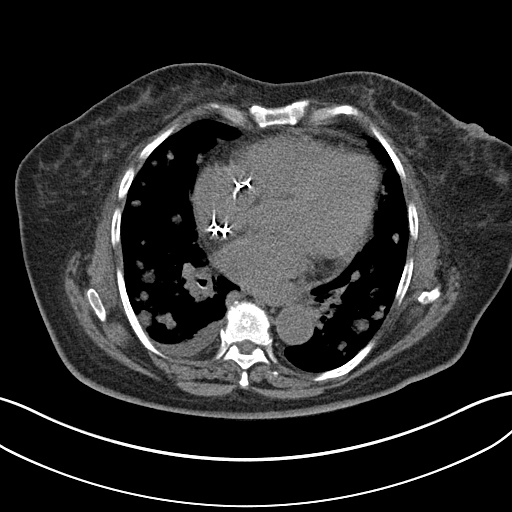
[im 25/62  soft-tissue]
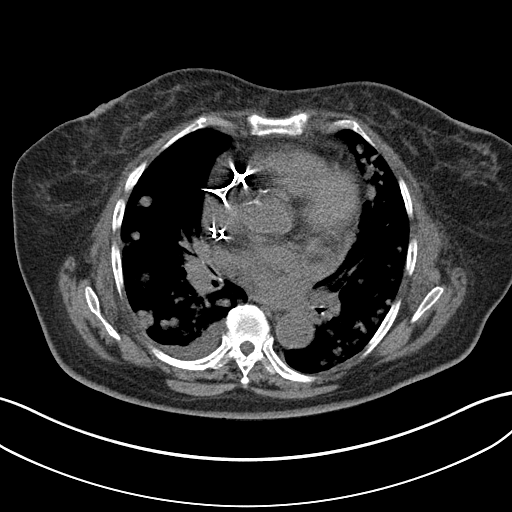
[im 29/62  soft-tissue]
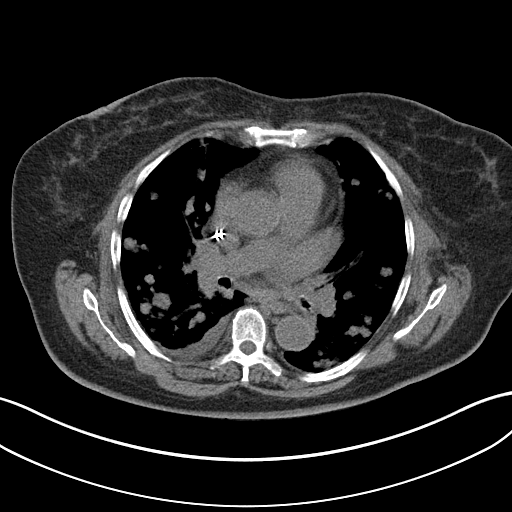
[im 33/62  soft-tissue]
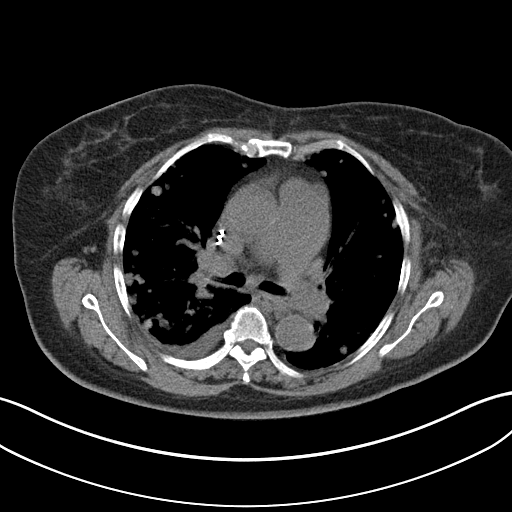
[im 37/62  soft-tissue]
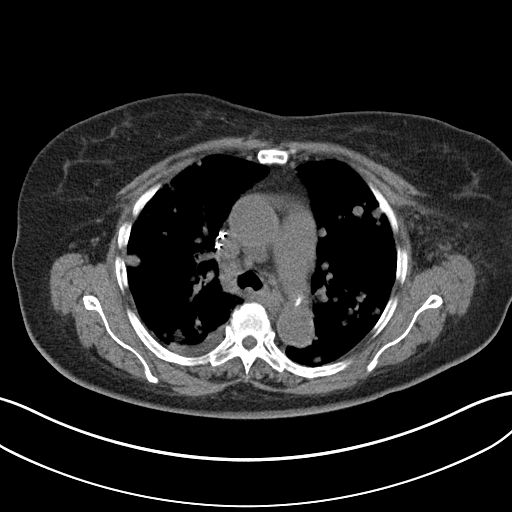
[im 41/62  soft-tissue]
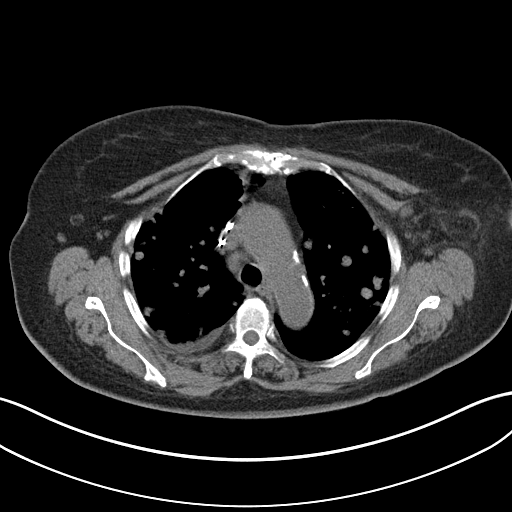
[im 41/62  bone]
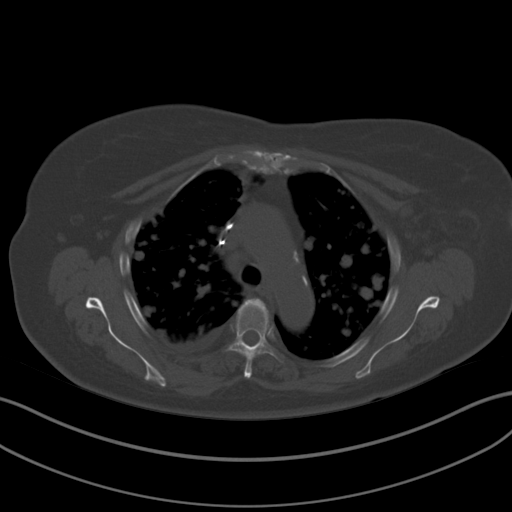
[im 45/62  lung]
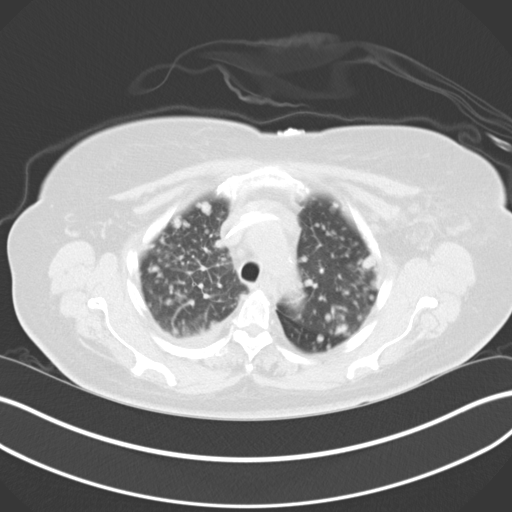
[im 49/62  soft-tissue]
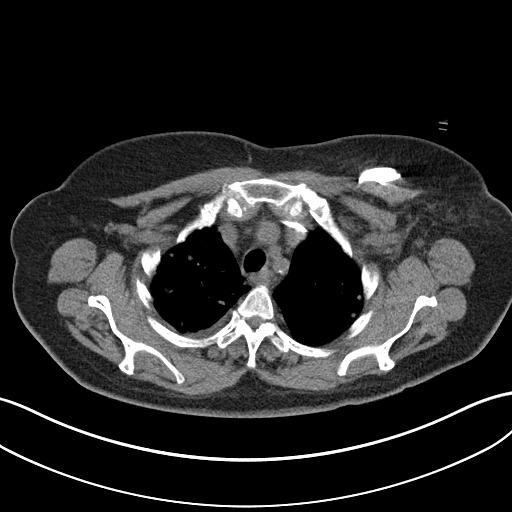
[im 49/62  lung]
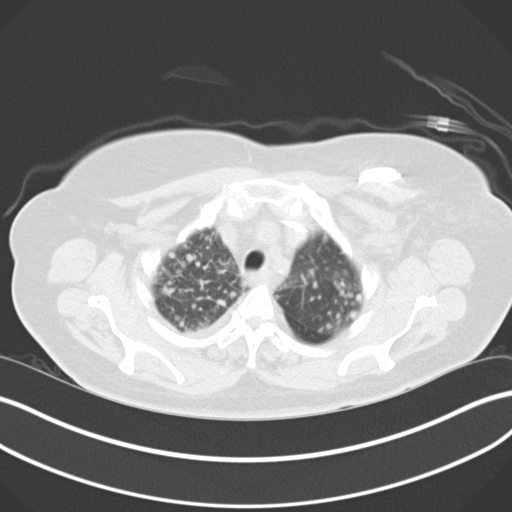
[im 53/62  soft-tissue]
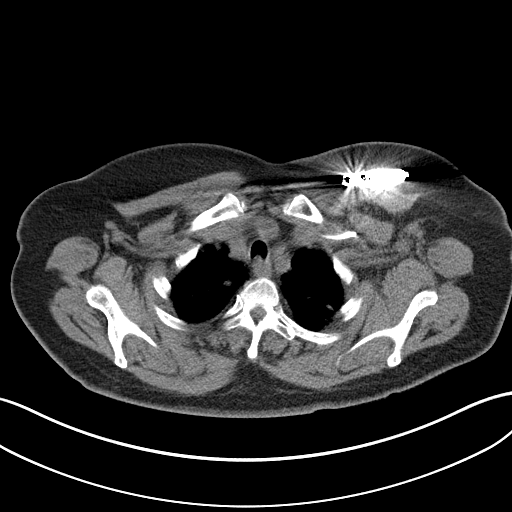
[im 53/62  lung]
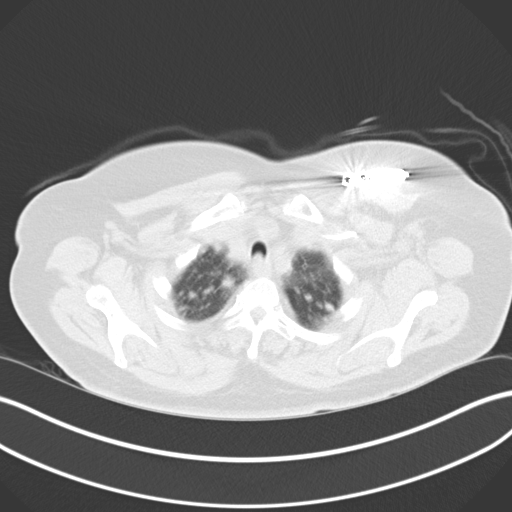
[im 57/62  soft-tissue]
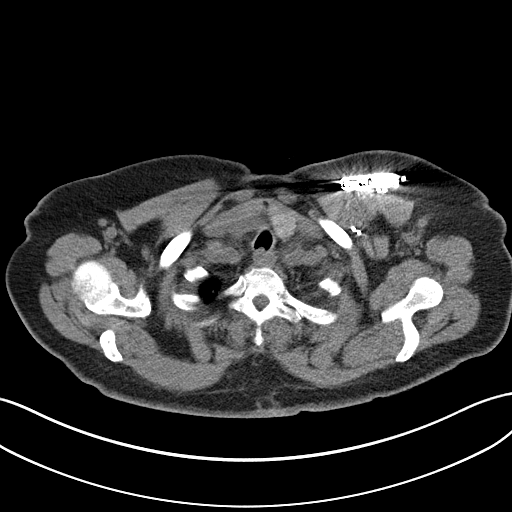
[im 57/62  lung]
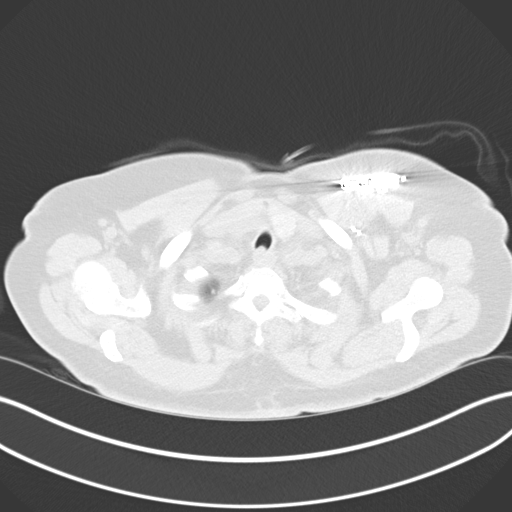

[13 of 32 positions shown; findings below may reference images not displayed]

EXAM:
CT-guided biopsy right lower lobe pulmonary nodule

MEDICATIONS:
None.

ANESTHESIA/SEDATION:
Fentanyl 100 mcg IV; Versed 2 mg IV

Moderate Sedation Time:  15 minutes

The patient was continuously monitored during the procedure by the
interventional radiology nurse under my direct supervision.

FLUOROSCOPY TIME:  None

COMPLICATIONS:
None immediate.

Estimated blood loss:  0

PROCEDURE:
Informed written consent was obtained from the patient after a
thorough discussion of the procedural risks, benefits and
alternatives. All questions were addressed. Maximal Sterile Barrier
Technique was utilized including caps, mask, sterile gowns, sterile
gloves, sterile drape, hand hygiene and skin antiseptic. A timeout
was performed prior to the initiation of the procedure.

A planning axial CT scan was performed. The nodule in the right
lower lobe was successfully identified. A suitable skin entry site
was selected and marked. The region was then sterilely prepped and
draped in standard fashion using Betadine skin prep. Local
anesthesia was attained by infiltration with 1% lidocaine. A small
dermatotomy was made. Under intermittent CT fluoroscopic guidance, a
17 gauge trocar needle was advanced into the lung and positioned at
the margin of the nodule.

Multiple 18 gauge core biopsies were then coaxially obtained using
the BioPince automated biopsy device. Biopsy specimens were placed
in formalin and delivered to pathology for further analysis. The
biopsy device and introducer needle were removed. Post biopsy axial
CT imaging demonstrates no evidence of immediate complication. A bio
sentry device was deployed. There is no pneumothorax. Mild
perilesional alveolar hemorrhage is not unexpected. The patient
tolerated the procedure well.
IMPRESSION: Technically successful CT-guided biopsy right lower lobe pulmonary
nodule.
